# Patient Record
Sex: Male | Born: 1995
Health system: Southern US, Community
[De-identification: ages and names within clinical notes are randomized; demographics above are authoritative.]

## PROBLEM LIST (undated history)

## (undated) DIAGNOSIS — F419 Anxiety disorder, unspecified: Secondary | ICD-10-CM

## (undated) DIAGNOSIS — F429 Obsessive-compulsive disorder, unspecified: Secondary | ICD-10-CM

## (undated) DIAGNOSIS — F988 Other specified behavioral and emotional disorders with onset usually occurring in childhood and adolescence: Secondary | ICD-10-CM

## (undated) DIAGNOSIS — F909 Attention-deficit hyperactivity disorder, unspecified type: Secondary | ICD-10-CM

## (undated) DIAGNOSIS — K5090 Crohn's disease, unspecified, without complications: Secondary | ICD-10-CM

---
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
## 2017-03-12 DIAGNOSIS — K5090 Crohn's disease, unspecified, without complications: Secondary | ICD-10-CM | POA: Diagnosis not present

## 2017-04-15 DIAGNOSIS — K5090 Crohn's disease, unspecified, without complications: Secondary | ICD-10-CM | POA: Diagnosis not present

## 2017-04-16 ENCOUNTER — Ambulatory Visit: Payer: 59

## 2017-04-16 ENCOUNTER — Other Ambulatory Visit: Payer: Self-pay | Admitting: Gastroenterology

## 2017-04-16 ENCOUNTER — Encounter (INDEPENDENT_AMBULATORY_CARE_PROVIDER_SITE_OTHER): Payer: Self-pay

## 2017-04-16 DIAGNOSIS — K50919 Crohn's disease, unspecified, with unspecified complications: Secondary | ICD-10-CM

## 2017-04-16 DIAGNOSIS — K5000 Crohn's disease of small intestine without complications: Secondary | ICD-10-CM | POA: Diagnosis not present

## 2017-04-28 DIAGNOSIS — D1801 Hemangioma of skin and subcutaneous tissue: Secondary | ICD-10-CM | POA: Diagnosis not present

## 2017-04-28 DIAGNOSIS — D2239 Melanocytic nevi of other parts of face: Secondary | ICD-10-CM | POA: Diagnosis not present

## 2017-04-28 DIAGNOSIS — D485 Neoplasm of uncertain behavior of skin: Secondary | ICD-10-CM | POA: Diagnosis not present

## 2017-05-04 ENCOUNTER — Other Ambulatory Visit (HOSPITAL_COMMUNITY): Payer: Self-pay | Admitting: *Deleted

## 2017-05-04 DIAGNOSIS — K5090 Crohn's disease, unspecified, without complications: Secondary | ICD-10-CM | POA: Diagnosis not present

## 2017-05-04 DIAGNOSIS — Z6820 Body mass index (BMI) 20.0-20.9, adult: Secondary | ICD-10-CM | POA: Diagnosis not present

## 2017-05-05 ENCOUNTER — Ambulatory Visit (HOSPITAL_COMMUNITY): Admission: RE | Admit: 2017-05-05 | Payer: 59 | Source: Ambulatory Visit

## 2017-05-06 DIAGNOSIS — K5090 Crohn's disease, unspecified, without complications: Secondary | ICD-10-CM | POA: Diagnosis not present

## 2017-06-26 DIAGNOSIS — F419 Anxiety disorder, unspecified: Secondary | ICD-10-CM | POA: Diagnosis not present

## 2017-06-26 DIAGNOSIS — F429 Obsessive-compulsive disorder, unspecified: Secondary | ICD-10-CM | POA: Diagnosis not present

## 2017-06-26 MED FILL — FLUoxetine HCL 10 MG CAPS: 10 | 30 days supply | Qty: 30 | Fill #0

## 2017-07-01 DIAGNOSIS — K5090 Crohn's disease, unspecified, without complications: Secondary | ICD-10-CM | POA: Diagnosis not present

## 2017-07-10 DIAGNOSIS — Z23 Encounter for immunization: Secondary | ICD-10-CM | POA: Diagnosis not present

## 2017-07-10 DIAGNOSIS — F419 Anxiety disorder, unspecified: Secondary | ICD-10-CM | POA: Diagnosis not present

## 2017-07-10 MED FILL — FLUoxetine HCL 20 MG CAPS: 20 | 90 days supply | Qty: 90 | Fill #0

## 2017-08-26 DIAGNOSIS — K5090 Crohn's disease, unspecified, without complications: Secondary | ICD-10-CM | POA: Diagnosis not present

## 2017-09-09 DIAGNOSIS — M9903 Segmental and somatic dysfunction of lumbar region: Secondary | ICD-10-CM | POA: Diagnosis not present

## 2017-09-09 DIAGNOSIS — M9905 Segmental and somatic dysfunction of pelvic region: Secondary | ICD-10-CM | POA: Diagnosis not present

## 2017-09-09 DIAGNOSIS — M9908 Segmental and somatic dysfunction of rib cage: Secondary | ICD-10-CM | POA: Diagnosis not present

## 2017-09-09 DIAGNOSIS — M5415 Radiculopathy, thoracolumbar region: Secondary | ICD-10-CM | POA: Diagnosis not present

## 2017-09-09 DIAGNOSIS — M791 Myalgia, unspecified site: Secondary | ICD-10-CM | POA: Diagnosis not present

## 2017-09-09 DIAGNOSIS — M9902 Segmental and somatic dysfunction of thoracic region: Secondary | ICD-10-CM | POA: Diagnosis not present

## 2017-09-11 DIAGNOSIS — M9903 Segmental and somatic dysfunction of lumbar region: Secondary | ICD-10-CM | POA: Diagnosis not present

## 2017-09-11 DIAGNOSIS — M9905 Segmental and somatic dysfunction of pelvic region: Secondary | ICD-10-CM | POA: Diagnosis not present

## 2017-09-11 DIAGNOSIS — M9908 Segmental and somatic dysfunction of rib cage: Secondary | ICD-10-CM | POA: Diagnosis not present

## 2017-09-11 DIAGNOSIS — M5415 Radiculopathy, thoracolumbar region: Secondary | ICD-10-CM | POA: Diagnosis not present

## 2017-09-11 DIAGNOSIS — M9902 Segmental and somatic dysfunction of thoracic region: Secondary | ICD-10-CM | POA: Diagnosis not present

## 2017-09-11 DIAGNOSIS — M791 Myalgia, unspecified site: Secondary | ICD-10-CM | POA: Diagnosis not present

## 2017-09-14 DIAGNOSIS — M5415 Radiculopathy, thoracolumbar region: Secondary | ICD-10-CM | POA: Diagnosis not present

## 2017-09-14 DIAGNOSIS — M9903 Segmental and somatic dysfunction of lumbar region: Secondary | ICD-10-CM | POA: Diagnosis not present

## 2017-09-14 DIAGNOSIS — M9905 Segmental and somatic dysfunction of pelvic region: Secondary | ICD-10-CM | POA: Diagnosis not present

## 2017-09-14 DIAGNOSIS — M9908 Segmental and somatic dysfunction of rib cage: Secondary | ICD-10-CM | POA: Diagnosis not present

## 2017-09-14 DIAGNOSIS — M791 Myalgia, unspecified site: Secondary | ICD-10-CM | POA: Diagnosis not present

## 2017-09-14 DIAGNOSIS — M9902 Segmental and somatic dysfunction of thoracic region: Secondary | ICD-10-CM | POA: Diagnosis not present

## 2017-09-16 DIAGNOSIS — M791 Myalgia, unspecified site: Secondary | ICD-10-CM | POA: Diagnosis not present

## 2017-09-16 DIAGNOSIS — M9908 Segmental and somatic dysfunction of rib cage: Secondary | ICD-10-CM | POA: Diagnosis not present

## 2017-09-16 DIAGNOSIS — M9902 Segmental and somatic dysfunction of thoracic region: Secondary | ICD-10-CM | POA: Diagnosis not present

## 2017-09-16 DIAGNOSIS — M9905 Segmental and somatic dysfunction of pelvic region: Secondary | ICD-10-CM | POA: Diagnosis not present

## 2017-09-16 DIAGNOSIS — M9903 Segmental and somatic dysfunction of lumbar region: Secondary | ICD-10-CM | POA: Diagnosis not present

## 2017-09-16 DIAGNOSIS — M5415 Radiculopathy, thoracolumbar region: Secondary | ICD-10-CM | POA: Diagnosis not present

## 2017-09-21 DIAGNOSIS — M791 Myalgia, unspecified site: Secondary | ICD-10-CM | POA: Diagnosis not present

## 2017-09-21 DIAGNOSIS — M5415 Radiculopathy, thoracolumbar region: Secondary | ICD-10-CM | POA: Diagnosis not present

## 2017-09-21 DIAGNOSIS — M9902 Segmental and somatic dysfunction of thoracic region: Secondary | ICD-10-CM | POA: Diagnosis not present

## 2017-09-21 DIAGNOSIS — M9903 Segmental and somatic dysfunction of lumbar region: Secondary | ICD-10-CM | POA: Diagnosis not present

## 2017-09-21 DIAGNOSIS — M9905 Segmental and somatic dysfunction of pelvic region: Secondary | ICD-10-CM | POA: Diagnosis not present

## 2017-09-21 DIAGNOSIS — M9908 Segmental and somatic dysfunction of rib cage: Secondary | ICD-10-CM | POA: Diagnosis not present

## 2017-09-23 DIAGNOSIS — M9902 Segmental and somatic dysfunction of thoracic region: Secondary | ICD-10-CM | POA: Diagnosis not present

## 2017-09-23 DIAGNOSIS — M5415 Radiculopathy, thoracolumbar region: Secondary | ICD-10-CM | POA: Diagnosis not present

## 2017-09-23 DIAGNOSIS — M9903 Segmental and somatic dysfunction of lumbar region: Secondary | ICD-10-CM | POA: Diagnosis not present

## 2017-09-23 DIAGNOSIS — M9908 Segmental and somatic dysfunction of rib cage: Secondary | ICD-10-CM | POA: Diagnosis not present

## 2017-09-23 DIAGNOSIS — M791 Myalgia, unspecified site: Secondary | ICD-10-CM | POA: Diagnosis not present

## 2017-09-23 DIAGNOSIS — M9905 Segmental and somatic dysfunction of pelvic region: Secondary | ICD-10-CM | POA: Diagnosis not present

## 2017-09-25 DIAGNOSIS — M791 Myalgia, unspecified site: Secondary | ICD-10-CM | POA: Diagnosis not present

## 2017-09-25 DIAGNOSIS — M9908 Segmental and somatic dysfunction of rib cage: Secondary | ICD-10-CM | POA: Diagnosis not present

## 2017-09-25 DIAGNOSIS — M9902 Segmental and somatic dysfunction of thoracic region: Secondary | ICD-10-CM | POA: Diagnosis not present

## 2017-09-25 DIAGNOSIS — M5415 Radiculopathy, thoracolumbar region: Secondary | ICD-10-CM | POA: Diagnosis not present

## 2017-09-25 DIAGNOSIS — M9905 Segmental and somatic dysfunction of pelvic region: Secondary | ICD-10-CM | POA: Diagnosis not present

## 2017-09-25 DIAGNOSIS — M9903 Segmental and somatic dysfunction of lumbar region: Secondary | ICD-10-CM | POA: Diagnosis not present

## 2017-09-28 DIAGNOSIS — M5415 Radiculopathy, thoracolumbar region: Secondary | ICD-10-CM | POA: Diagnosis not present

## 2017-09-28 DIAGNOSIS — M791 Myalgia, unspecified site: Secondary | ICD-10-CM | POA: Diagnosis not present

## 2017-09-28 DIAGNOSIS — M9908 Segmental and somatic dysfunction of rib cage: Secondary | ICD-10-CM | POA: Diagnosis not present

## 2017-09-28 DIAGNOSIS — M9903 Segmental and somatic dysfunction of lumbar region: Secondary | ICD-10-CM | POA: Diagnosis not present

## 2017-09-28 DIAGNOSIS — M9902 Segmental and somatic dysfunction of thoracic region: Secondary | ICD-10-CM | POA: Diagnosis not present

## 2017-09-28 DIAGNOSIS — M9905 Segmental and somatic dysfunction of pelvic region: Secondary | ICD-10-CM | POA: Diagnosis not present

## 2017-10-02 DIAGNOSIS — M5415 Radiculopathy, thoracolumbar region: Secondary | ICD-10-CM | POA: Diagnosis not present

## 2017-10-02 DIAGNOSIS — M791 Myalgia, unspecified site: Secondary | ICD-10-CM | POA: Diagnosis not present

## 2017-10-02 DIAGNOSIS — M9908 Segmental and somatic dysfunction of rib cage: Secondary | ICD-10-CM | POA: Diagnosis not present

## 2017-10-02 DIAGNOSIS — M9903 Segmental and somatic dysfunction of lumbar region: Secondary | ICD-10-CM | POA: Diagnosis not present

## 2017-10-02 DIAGNOSIS — M9902 Segmental and somatic dysfunction of thoracic region: Secondary | ICD-10-CM | POA: Diagnosis not present

## 2017-10-02 DIAGNOSIS — M9905 Segmental and somatic dysfunction of pelvic region: Secondary | ICD-10-CM | POA: Diagnosis not present

## 2017-10-07 DIAGNOSIS — M791 Myalgia, unspecified site: Secondary | ICD-10-CM | POA: Diagnosis not present

## 2017-10-07 DIAGNOSIS — M5415 Radiculopathy, thoracolumbar region: Secondary | ICD-10-CM | POA: Diagnosis not present

## 2017-10-07 DIAGNOSIS — M9908 Segmental and somatic dysfunction of rib cage: Secondary | ICD-10-CM | POA: Diagnosis not present

## 2017-10-07 DIAGNOSIS — M9902 Segmental and somatic dysfunction of thoracic region: Secondary | ICD-10-CM | POA: Diagnosis not present

## 2017-10-07 DIAGNOSIS — M9903 Segmental and somatic dysfunction of lumbar region: Secondary | ICD-10-CM | POA: Diagnosis not present

## 2017-10-07 DIAGNOSIS — M9905 Segmental and somatic dysfunction of pelvic region: Secondary | ICD-10-CM | POA: Diagnosis not present

## 2017-10-08 MED FILL — FLUoxetine HCL 20 MG CAPS: 20 | 90 days supply | Qty: 90 | Fill #1

## 2017-10-09 DIAGNOSIS — M5415 Radiculopathy, thoracolumbar region: Secondary | ICD-10-CM | POA: Diagnosis not present

## 2017-10-09 DIAGNOSIS — M791 Myalgia, unspecified site: Secondary | ICD-10-CM | POA: Diagnosis not present

## 2017-10-09 DIAGNOSIS — M9902 Segmental and somatic dysfunction of thoracic region: Secondary | ICD-10-CM | POA: Diagnosis not present

## 2017-10-09 DIAGNOSIS — M9903 Segmental and somatic dysfunction of lumbar region: Secondary | ICD-10-CM | POA: Diagnosis not present

## 2017-10-09 DIAGNOSIS — M9905 Segmental and somatic dysfunction of pelvic region: Secondary | ICD-10-CM | POA: Diagnosis not present

## 2017-10-09 DIAGNOSIS — M9908 Segmental and somatic dysfunction of rib cage: Secondary | ICD-10-CM | POA: Diagnosis not present

## 2017-10-14 DIAGNOSIS — M791 Myalgia, unspecified site: Secondary | ICD-10-CM | POA: Diagnosis not present

## 2017-10-14 DIAGNOSIS — M5415 Radiculopathy, thoracolumbar region: Secondary | ICD-10-CM | POA: Diagnosis not present

## 2017-10-14 DIAGNOSIS — M9908 Segmental and somatic dysfunction of rib cage: Secondary | ICD-10-CM | POA: Diagnosis not present

## 2017-10-14 DIAGNOSIS — M9905 Segmental and somatic dysfunction of pelvic region: Secondary | ICD-10-CM | POA: Diagnosis not present

## 2017-10-14 DIAGNOSIS — M9903 Segmental and somatic dysfunction of lumbar region: Secondary | ICD-10-CM | POA: Diagnosis not present

## 2017-10-14 DIAGNOSIS — M9902 Segmental and somatic dysfunction of thoracic region: Secondary | ICD-10-CM | POA: Diagnosis not present

## 2017-10-22 DIAGNOSIS — K5090 Crohn's disease, unspecified, without complications: Secondary | ICD-10-CM | POA: Diagnosis not present

## 2017-10-22 DIAGNOSIS — Z79899 Other long term (current) drug therapy: Secondary | ICD-10-CM | POA: Diagnosis not present

## 2017-10-29 DIAGNOSIS — M791 Myalgia, unspecified site: Secondary | ICD-10-CM | POA: Diagnosis not present

## 2017-10-29 DIAGNOSIS — M5415 Radiculopathy, thoracolumbar region: Secondary | ICD-10-CM | POA: Diagnosis not present

## 2017-10-29 DIAGNOSIS — M9905 Segmental and somatic dysfunction of pelvic region: Secondary | ICD-10-CM | POA: Diagnosis not present

## 2017-10-29 DIAGNOSIS — M9903 Segmental and somatic dysfunction of lumbar region: Secondary | ICD-10-CM | POA: Diagnosis not present

## 2017-10-29 DIAGNOSIS — M9902 Segmental and somatic dysfunction of thoracic region: Secondary | ICD-10-CM | POA: Diagnosis not present

## 2017-10-29 DIAGNOSIS — M9908 Segmental and somatic dysfunction of rib cage: Secondary | ICD-10-CM | POA: Diagnosis not present

## 2017-11-12 DIAGNOSIS — K5090 Crohn's disease, unspecified, without complications: Secondary | ICD-10-CM | POA: Diagnosis not present

## 2017-11-19 DIAGNOSIS — M9903 Segmental and somatic dysfunction of lumbar region: Secondary | ICD-10-CM | POA: Diagnosis not present

## 2017-11-19 DIAGNOSIS — M9905 Segmental and somatic dysfunction of pelvic region: Secondary | ICD-10-CM | POA: Diagnosis not present

## 2017-11-19 DIAGNOSIS — M5415 Radiculopathy, thoracolumbar region: Secondary | ICD-10-CM | POA: Diagnosis not present

## 2017-11-19 DIAGNOSIS — M9902 Segmental and somatic dysfunction of thoracic region: Secondary | ICD-10-CM | POA: Diagnosis not present

## 2017-11-19 DIAGNOSIS — M9908 Segmental and somatic dysfunction of rib cage: Secondary | ICD-10-CM | POA: Diagnosis not present

## 2017-11-19 DIAGNOSIS — M791 Myalgia, unspecified site: Secondary | ICD-10-CM | POA: Diagnosis not present

## 2017-12-17 DIAGNOSIS — K5090 Crohn's disease, unspecified, without complications: Secondary | ICD-10-CM | POA: Diagnosis not present

## 2017-12-18 DIAGNOSIS — M791 Myalgia, unspecified site: Secondary | ICD-10-CM | POA: Diagnosis not present

## 2017-12-18 DIAGNOSIS — M9902 Segmental and somatic dysfunction of thoracic region: Secondary | ICD-10-CM | POA: Diagnosis not present

## 2017-12-18 DIAGNOSIS — M9908 Segmental and somatic dysfunction of rib cage: Secondary | ICD-10-CM | POA: Diagnosis not present

## 2017-12-18 DIAGNOSIS — M5415 Radiculopathy, thoracolumbar region: Secondary | ICD-10-CM | POA: Diagnosis not present

## 2017-12-18 DIAGNOSIS — M9905 Segmental and somatic dysfunction of pelvic region: Secondary | ICD-10-CM | POA: Diagnosis not present

## 2017-12-18 DIAGNOSIS — M9903 Segmental and somatic dysfunction of lumbar region: Secondary | ICD-10-CM | POA: Diagnosis not present

## 2017-12-30 DIAGNOSIS — K5080 Crohn's disease of both small and large intestine without complications: Secondary | ICD-10-CM | POA: Diagnosis not present

## 2017-12-30 MED FILL — VIT D2 1.25 MG (50,000 UNIT: 1.25 MG | 56 days supply | Qty: 8 | Fill #0

## 2017-12-31 DIAGNOSIS — K5080 Crohn's disease of both small and large intestine without complications: Secondary | ICD-10-CM | POA: Diagnosis not present

## 2018-01-11 MED FILL — FLUoxetine HCL 20 MG CAPS: 20 | 90 days supply | Qty: 90 | Fill #2

## 2018-01-29 DIAGNOSIS — M9908 Segmental and somatic dysfunction of rib cage: Secondary | ICD-10-CM | POA: Diagnosis not present

## 2018-01-29 DIAGNOSIS — M791 Myalgia, unspecified site: Secondary | ICD-10-CM | POA: Diagnosis not present

## 2018-01-29 DIAGNOSIS — M9903 Segmental and somatic dysfunction of lumbar region: Secondary | ICD-10-CM | POA: Diagnosis not present

## 2018-01-29 DIAGNOSIS — M9905 Segmental and somatic dysfunction of pelvic region: Secondary | ICD-10-CM | POA: Diagnosis not present

## 2018-01-29 DIAGNOSIS — M5415 Radiculopathy, thoracolumbar region: Secondary | ICD-10-CM | POA: Diagnosis not present

## 2018-01-29 DIAGNOSIS — M9902 Segmental and somatic dysfunction of thoracic region: Secondary | ICD-10-CM | POA: Diagnosis not present

## 2018-02-11 DIAGNOSIS — K5090 Crohn's disease, unspecified, without complications: Secondary | ICD-10-CM | POA: Diagnosis not present

## 2018-02-12 MED FILL — VIT D2 1.25 MG (50,000 UNIT: 1.25 MG | 56 days supply | Qty: 8 | Fill #1

## 2018-02-16 DIAGNOSIS — D1801 Hemangioma of skin and subcutaneous tissue: Secondary | ICD-10-CM | POA: Diagnosis not present

## 2018-03-11 DIAGNOSIS — M791 Myalgia, unspecified site: Secondary | ICD-10-CM | POA: Diagnosis not present

## 2018-03-11 DIAGNOSIS — M9905 Segmental and somatic dysfunction of pelvic region: Secondary | ICD-10-CM | POA: Diagnosis not present

## 2018-03-11 DIAGNOSIS — M5415 Radiculopathy, thoracolumbar region: Secondary | ICD-10-CM | POA: Diagnosis not present

## 2018-03-11 DIAGNOSIS — M9908 Segmental and somatic dysfunction of rib cage: Secondary | ICD-10-CM | POA: Diagnosis not present

## 2018-03-11 DIAGNOSIS — M9903 Segmental and somatic dysfunction of lumbar region: Secondary | ICD-10-CM | POA: Diagnosis not present

## 2018-03-11 DIAGNOSIS — M9902 Segmental and somatic dysfunction of thoracic region: Secondary | ICD-10-CM | POA: Diagnosis not present

## 2018-04-08 DIAGNOSIS — K5080 Crohn's disease of both small and large intestine without complications: Secondary | ICD-10-CM | POA: Diagnosis not present

## 2018-04-08 MED FILL — VIT D2 1.25 MG (50,000 UNIT: 1.25 MG | 56 days supply | Qty: 8 | Fill #2

## 2018-04-08 MED FILL — FLUoxetine HCL 20 MG CAPS: 20 | 90 days supply | Qty: 90 | Fill #0

## 2018-05-06 DIAGNOSIS — M9905 Segmental and somatic dysfunction of pelvic region: Secondary | ICD-10-CM | POA: Diagnosis not present

## 2018-05-06 DIAGNOSIS — M5415 Radiculopathy, thoracolumbar region: Secondary | ICD-10-CM | POA: Diagnosis not present

## 2018-05-06 DIAGNOSIS — H5203 Hypermetropia, bilateral: Secondary | ICD-10-CM | POA: Diagnosis not present

## 2018-05-06 DIAGNOSIS — M791 Myalgia, unspecified site: Secondary | ICD-10-CM | POA: Diagnosis not present

## 2018-05-06 DIAGNOSIS — M9903 Segmental and somatic dysfunction of lumbar region: Secondary | ICD-10-CM | POA: Diagnosis not present

## 2018-05-06 DIAGNOSIS — M9902 Segmental and somatic dysfunction of thoracic region: Secondary | ICD-10-CM | POA: Diagnosis not present

## 2018-05-06 DIAGNOSIS — M9908 Segmental and somatic dysfunction of rib cage: Secondary | ICD-10-CM | POA: Diagnosis not present

## 2018-05-24 DIAGNOSIS — K5080 Crohn's disease of both small and large intestine without complications: Secondary | ICD-10-CM | POA: Diagnosis not present

## 2018-05-31 DIAGNOSIS — K5080 Crohn's disease of both small and large intestine without complications: Secondary | ICD-10-CM | POA: Diagnosis not present

## 2018-06-11 DIAGNOSIS — M5415 Radiculopathy, thoracolumbar region: Secondary | ICD-10-CM | POA: Diagnosis not present

## 2018-06-11 DIAGNOSIS — M9903 Segmental and somatic dysfunction of lumbar region: Secondary | ICD-10-CM | POA: Diagnosis not present

## 2018-06-11 DIAGNOSIS — M791 Myalgia, unspecified site: Secondary | ICD-10-CM | POA: Diagnosis not present

## 2018-06-11 DIAGNOSIS — M9908 Segmental and somatic dysfunction of rib cage: Secondary | ICD-10-CM | POA: Diagnosis not present

## 2018-06-11 DIAGNOSIS — M9905 Segmental and somatic dysfunction of pelvic region: Secondary | ICD-10-CM | POA: Diagnosis not present

## 2018-06-11 DIAGNOSIS — M9902 Segmental and somatic dysfunction of thoracic region: Secondary | ICD-10-CM | POA: Diagnosis not present

## 2018-07-12 MED FILL — FLUoxetine HCL 20 MG CAPS: 20 | 90 days supply | Qty: 90 | Fill #1

## 2018-07-23 DIAGNOSIS — Z23 Encounter for immunization: Secondary | ICD-10-CM | POA: Diagnosis not present

## 2018-07-27 DIAGNOSIS — K5080 Crohn's disease of both small and large intestine without complications: Secondary | ICD-10-CM | POA: Diagnosis not present

## 2018-08-06 DIAGNOSIS — M5415 Radiculopathy, thoracolumbar region: Secondary | ICD-10-CM | POA: Diagnosis not present

## 2018-08-06 DIAGNOSIS — M791 Myalgia, unspecified site: Secondary | ICD-10-CM | POA: Diagnosis not present

## 2018-08-06 DIAGNOSIS — M9905 Segmental and somatic dysfunction of pelvic region: Secondary | ICD-10-CM | POA: Diagnosis not present

## 2018-08-06 DIAGNOSIS — M9903 Segmental and somatic dysfunction of lumbar region: Secondary | ICD-10-CM | POA: Diagnosis not present

## 2018-08-06 DIAGNOSIS — M9908 Segmental and somatic dysfunction of rib cage: Secondary | ICD-10-CM | POA: Diagnosis not present

## 2018-08-06 DIAGNOSIS — M9902 Segmental and somatic dysfunction of thoracic region: Secondary | ICD-10-CM | POA: Diagnosis not present

## 2018-08-10 DIAGNOSIS — K5080 Crohn's disease of both small and large intestine without complications: Secondary | ICD-10-CM | POA: Diagnosis not present

## 2018-08-13 DIAGNOSIS — K5080 Crohn's disease of both small and large intestine without complications: Secondary | ICD-10-CM | POA: Diagnosis not present

## 2018-08-30 MED FILL — FOLIC ACID 1 MG TABS: 1 | 90 days supply | Qty: 90 | Fill #0

## 2018-08-30 MED FILL — METHOTREXATE SODIUM 2.5 MG: 2.5 | 28 days supply | Qty: 24 | Fill #0

## 2018-09-17 DIAGNOSIS — M5415 Radiculopathy, thoracolumbar region: Secondary | ICD-10-CM | POA: Diagnosis not present

## 2018-09-17 DIAGNOSIS — M9908 Segmental and somatic dysfunction of rib cage: Secondary | ICD-10-CM | POA: Diagnosis not present

## 2018-09-17 DIAGNOSIS — M9905 Segmental and somatic dysfunction of pelvic region: Secondary | ICD-10-CM | POA: Diagnosis not present

## 2018-09-17 DIAGNOSIS — M9902 Segmental and somatic dysfunction of thoracic region: Secondary | ICD-10-CM | POA: Diagnosis not present

## 2018-09-17 DIAGNOSIS — M9903 Segmental and somatic dysfunction of lumbar region: Secondary | ICD-10-CM | POA: Diagnosis not present

## 2018-09-17 DIAGNOSIS — M791 Myalgia, unspecified site: Secondary | ICD-10-CM | POA: Diagnosis not present

## 2018-09-22 DIAGNOSIS — K5080 Crohn's disease of both small and large intestine without complications: Secondary | ICD-10-CM | POA: Diagnosis not present

## 2018-09-22 MED FILL — METHOTREXATE SODIUM 2.5 MG: 2.5 | 28 days supply | Qty: 24 | Fill #1

## 2018-10-01 DIAGNOSIS — M9908 Segmental and somatic dysfunction of rib cage: Secondary | ICD-10-CM | POA: Diagnosis not present

## 2018-10-01 DIAGNOSIS — M9905 Segmental and somatic dysfunction of pelvic region: Secondary | ICD-10-CM | POA: Diagnosis not present

## 2018-10-01 DIAGNOSIS — M9903 Segmental and somatic dysfunction of lumbar region: Secondary | ICD-10-CM | POA: Diagnosis not present

## 2018-10-01 DIAGNOSIS — M9902 Segmental and somatic dysfunction of thoracic region: Secondary | ICD-10-CM | POA: Diagnosis not present

## 2018-10-01 DIAGNOSIS — M5415 Radiculopathy, thoracolumbar region: Secondary | ICD-10-CM | POA: Diagnosis not present

## 2018-10-01 DIAGNOSIS — M791 Myalgia, unspecified site: Secondary | ICD-10-CM | POA: Diagnosis not present

## 2018-10-15 DIAGNOSIS — M5415 Radiculopathy, thoracolumbar region: Secondary | ICD-10-CM | POA: Diagnosis not present

## 2018-10-15 DIAGNOSIS — M9903 Segmental and somatic dysfunction of lumbar region: Secondary | ICD-10-CM | POA: Diagnosis not present

## 2018-10-15 DIAGNOSIS — M791 Myalgia, unspecified site: Secondary | ICD-10-CM | POA: Diagnosis not present

## 2018-10-15 DIAGNOSIS — M9902 Segmental and somatic dysfunction of thoracic region: Secondary | ICD-10-CM | POA: Diagnosis not present

## 2018-10-15 DIAGNOSIS — M9905 Segmental and somatic dysfunction of pelvic region: Secondary | ICD-10-CM | POA: Diagnosis not present

## 2018-10-15 DIAGNOSIS — M9908 Segmental and somatic dysfunction of rib cage: Secondary | ICD-10-CM | POA: Diagnosis not present

## 2018-10-15 MED FILL — FLUoxetine HCL 20 MG CAPS: 20 | 90 days supply | Qty: 90 | Fill #2

## 2018-10-25 DIAGNOSIS — M5415 Radiculopathy, thoracolumbar region: Secondary | ICD-10-CM | POA: Diagnosis not present

## 2018-10-25 DIAGNOSIS — M9908 Segmental and somatic dysfunction of rib cage: Secondary | ICD-10-CM | POA: Diagnosis not present

## 2018-10-25 DIAGNOSIS — M9902 Segmental and somatic dysfunction of thoracic region: Secondary | ICD-10-CM | POA: Diagnosis not present

## 2018-10-25 DIAGNOSIS — M9903 Segmental and somatic dysfunction of lumbar region: Secondary | ICD-10-CM | POA: Diagnosis not present

## 2018-10-25 DIAGNOSIS — M791 Myalgia, unspecified site: Secondary | ICD-10-CM | POA: Diagnosis not present

## 2018-10-25 DIAGNOSIS — M9905 Segmental and somatic dysfunction of pelvic region: Secondary | ICD-10-CM | POA: Diagnosis not present

## 2018-10-25 MED FILL — METHOTREXATE SODIUM 2.5 MG: 2.5 | 28 days supply | Qty: 24 | Fill #2

## 2018-11-15 DIAGNOSIS — K5080 Crohn's disease of both small and large intestine without complications: Secondary | ICD-10-CM | POA: Diagnosis not present

## 2018-11-22 MED FILL — METHOTREXATE SODIUM 2.5 MG: 2.5 | 28 days supply | Qty: 24 | Fill #3

## 2018-11-22 MED FILL — FOLIC ACID 1 MG TABS: 1 | 90 days supply | Qty: 90 | Fill #1

## 2018-11-26 DIAGNOSIS — M9903 Segmental and somatic dysfunction of lumbar region: Secondary | ICD-10-CM | POA: Diagnosis not present

## 2018-11-26 DIAGNOSIS — M9908 Segmental and somatic dysfunction of rib cage: Secondary | ICD-10-CM | POA: Diagnosis not present

## 2018-11-26 DIAGNOSIS — M9902 Segmental and somatic dysfunction of thoracic region: Secondary | ICD-10-CM | POA: Diagnosis not present

## 2018-11-26 DIAGNOSIS — M5415 Radiculopathy, thoracolumbar region: Secondary | ICD-10-CM | POA: Diagnosis not present

## 2018-11-26 DIAGNOSIS — M9905 Segmental and somatic dysfunction of pelvic region: Secondary | ICD-10-CM | POA: Diagnosis not present

## 2018-11-26 DIAGNOSIS — M791 Myalgia, unspecified site: Secondary | ICD-10-CM | POA: Diagnosis not present

## 2018-12-24 DIAGNOSIS — M9902 Segmental and somatic dysfunction of thoracic region: Secondary | ICD-10-CM | POA: Diagnosis not present

## 2018-12-24 DIAGNOSIS — M5415 Radiculopathy, thoracolumbar region: Secondary | ICD-10-CM | POA: Diagnosis not present

## 2018-12-24 DIAGNOSIS — M9905 Segmental and somatic dysfunction of pelvic region: Secondary | ICD-10-CM | POA: Diagnosis not present

## 2018-12-24 DIAGNOSIS — M9908 Segmental and somatic dysfunction of rib cage: Secondary | ICD-10-CM | POA: Diagnosis not present

## 2018-12-24 DIAGNOSIS — M9903 Segmental and somatic dysfunction of lumbar region: Secondary | ICD-10-CM | POA: Diagnosis not present

## 2018-12-24 DIAGNOSIS — M791 Myalgia, unspecified site: Secondary | ICD-10-CM | POA: Diagnosis not present

## 2018-12-28 MED FILL — METHOTREXATE SODIUM 2.5 MG: 2.5 | 28 days supply | Qty: 24 | Fill #0

## 2018-12-31 DIAGNOSIS — F419 Anxiety disorder, unspecified: Secondary | ICD-10-CM | POA: Diagnosis not present

## 2018-12-31 DIAGNOSIS — H6123 Impacted cerumen, bilateral: Secondary | ICD-10-CM | POA: Diagnosis not present

## 2018-12-31 MED FILL — FLUoxetine HCL 10 MG CAPS: 10 | 30 days supply | Qty: 30 | Fill #0

## 2018-12-31 MED FILL — SERTRALINE HCL 25 MG TABLET: 25 | 60 days supply | Qty: 60 | Fill #0 | Status: TO

## 2019-01-11 DIAGNOSIS — K5080 Crohn's disease of both small and large intestine without complications: Secondary | ICD-10-CM | POA: Diagnosis not present

## 2019-01-19 MED FILL — METHOTREXATE SODIUM 2.5 MG: 2.5 | 28 days supply | Qty: 24 | Fill #1 | Status: TO

## 2019-01-21 DIAGNOSIS — M9903 Segmental and somatic dysfunction of lumbar region: Secondary | ICD-10-CM | POA: Diagnosis not present

## 2019-01-21 DIAGNOSIS — M9902 Segmental and somatic dysfunction of thoracic region: Secondary | ICD-10-CM | POA: Diagnosis not present

## 2019-01-21 DIAGNOSIS — M791 Myalgia, unspecified site: Secondary | ICD-10-CM | POA: Diagnosis not present

## 2019-01-21 DIAGNOSIS — M9908 Segmental and somatic dysfunction of rib cage: Secondary | ICD-10-CM | POA: Diagnosis not present

## 2019-01-21 DIAGNOSIS — M9905 Segmental and somatic dysfunction of pelvic region: Secondary | ICD-10-CM | POA: Diagnosis not present

## 2019-01-21 DIAGNOSIS — M5415 Radiculopathy, thoracolumbar region: Secondary | ICD-10-CM | POA: Diagnosis not present

## 2019-02-02 MED FILL — FOLIC ACID 1 MG TABLET: 1 | 90 days supply | Qty: 90 | Fill #0

## 2019-02-08 MED FILL — SERTRALINE HCL 50 MG TABLET: 50 | 90 days supply | Qty: 90 | Fill #0

## 2019-02-10 MED FILL — METHOTREXATE SODIUM 2.5 MG: 2.5 | 28 days supply | Qty: 24 | Fill #0

## 2019-02-16 DIAGNOSIS — K5080 Crohn's disease of both small and large intestine without complications: Secondary | ICD-10-CM | POA: Diagnosis not present

## 2019-02-16 DIAGNOSIS — Z79899 Other long term (current) drug therapy: Secondary | ICD-10-CM | POA: Diagnosis not present

## 2019-02-17 MED FILL — SERTRALINE HCL 25 MG TABLET: 25 | 60 days supply | Qty: 60 | Fill #0

## 2019-02-25 DIAGNOSIS — M791 Myalgia, unspecified site: Secondary | ICD-10-CM | POA: Diagnosis not present

## 2019-02-25 DIAGNOSIS — M9908 Segmental and somatic dysfunction of rib cage: Secondary | ICD-10-CM | POA: Diagnosis not present

## 2019-02-25 DIAGNOSIS — M9902 Segmental and somatic dysfunction of thoracic region: Secondary | ICD-10-CM | POA: Diagnosis not present

## 2019-02-25 DIAGNOSIS — M9903 Segmental and somatic dysfunction of lumbar region: Secondary | ICD-10-CM | POA: Diagnosis not present

## 2019-02-25 DIAGNOSIS — M9905 Segmental and somatic dysfunction of pelvic region: Secondary | ICD-10-CM | POA: Diagnosis not present

## 2019-02-25 DIAGNOSIS — M5415 Radiculopathy, thoracolumbar region: Secondary | ICD-10-CM | POA: Diagnosis not present

## 2019-03-01 DIAGNOSIS — Z79899 Other long term (current) drug therapy: Secondary | ICD-10-CM | POA: Diagnosis not present

## 2019-03-01 DIAGNOSIS — K5080 Crohn's disease of both small and large intestine without complications: Secondary | ICD-10-CM | POA: Diagnosis not present

## 2019-03-02 DIAGNOSIS — K5080 Crohn's disease of both small and large intestine without complications: Secondary | ICD-10-CM | POA: Diagnosis not present

## 2019-03-04 MED FILL — METHOTREXATE SODIUM 2.5 MG: 2.5 | 28 days supply | Qty: 24 | Fill #1

## 2019-03-09 DIAGNOSIS — K5080 Crohn's disease of both small and large intestine without complications: Secondary | ICD-10-CM | POA: Diagnosis not present

## 2019-03-09 DIAGNOSIS — Z79899 Other long term (current) drug therapy: Secondary | ICD-10-CM | POA: Diagnosis not present

## 2019-04-02 MED FILL — METHOTREXATE SODIUM 2.5 MG: 2.5 | 84 days supply | Qty: 72 | Fill #0

## 2019-04-12 MED FILL — SERTRALINE HCL 25 MG TABLET: 25 | 60 days supply | Qty: 60 | Fill #1

## 2019-04-18 MED FILL — METHOTREXATE SODIUM 2.5 MG: 2.5 | 84 days supply | Qty: 72 | Fill #0

## 2019-04-27 DIAGNOSIS — M9905 Segmental and somatic dysfunction of pelvic region: Secondary | ICD-10-CM | POA: Diagnosis not present

## 2019-04-27 DIAGNOSIS — M9903 Segmental and somatic dysfunction of lumbar region: Secondary | ICD-10-CM | POA: Diagnosis not present

## 2019-04-27 DIAGNOSIS — M791 Myalgia, unspecified site: Secondary | ICD-10-CM | POA: Diagnosis not present

## 2019-04-27 DIAGNOSIS — M5415 Radiculopathy, thoracolumbar region: Secondary | ICD-10-CM | POA: Diagnosis not present

## 2019-04-27 DIAGNOSIS — M9908 Segmental and somatic dysfunction of rib cage: Secondary | ICD-10-CM | POA: Diagnosis not present

## 2019-04-27 DIAGNOSIS — M9902 Segmental and somatic dysfunction of thoracic region: Secondary | ICD-10-CM | POA: Diagnosis not present

## 2019-04-29 MED FILL — FOLIC ACID 1 MG TABS: 1 | 90 days supply | Qty: 90 | Fill #1

## 2019-05-03 DIAGNOSIS — K5080 Crohn's disease of both small and large intestine without complications: Secondary | ICD-10-CM | POA: Diagnosis not present

## 2019-05-05 MED FILL — SERTRALINE HCL 50 MG TABS: 50 | 90 days supply | Qty: 90 | Fill #1

## 2019-05-24 DIAGNOSIS — H5203 Hypermetropia, bilateral: Secondary | ICD-10-CM | POA: Diagnosis not present

## 2019-05-25 DIAGNOSIS — M791 Myalgia, unspecified site: Secondary | ICD-10-CM | POA: Diagnosis not present

## 2019-05-25 DIAGNOSIS — M5415 Radiculopathy, thoracolumbar region: Secondary | ICD-10-CM | POA: Diagnosis not present

## 2019-05-25 DIAGNOSIS — M9908 Segmental and somatic dysfunction of rib cage: Secondary | ICD-10-CM | POA: Diagnosis not present

## 2019-05-25 DIAGNOSIS — M9902 Segmental and somatic dysfunction of thoracic region: Secondary | ICD-10-CM | POA: Diagnosis not present

## 2019-05-25 DIAGNOSIS — M9905 Segmental and somatic dysfunction of pelvic region: Secondary | ICD-10-CM | POA: Diagnosis not present

## 2019-05-25 DIAGNOSIS — M9903 Segmental and somatic dysfunction of lumbar region: Secondary | ICD-10-CM | POA: Diagnosis not present

## 2019-06-30 DIAGNOSIS — M791 Myalgia, unspecified site: Secondary | ICD-10-CM | POA: Diagnosis not present

## 2019-06-30 DIAGNOSIS — M9902 Segmental and somatic dysfunction of thoracic region: Secondary | ICD-10-CM | POA: Diagnosis not present

## 2019-06-30 DIAGNOSIS — M9905 Segmental and somatic dysfunction of pelvic region: Secondary | ICD-10-CM | POA: Diagnosis not present

## 2019-06-30 DIAGNOSIS — M5415 Radiculopathy, thoracolumbar region: Secondary | ICD-10-CM | POA: Diagnosis not present

## 2019-06-30 DIAGNOSIS — M9903 Segmental and somatic dysfunction of lumbar region: Secondary | ICD-10-CM | POA: Diagnosis not present

## 2019-06-30 DIAGNOSIS — M9908 Segmental and somatic dysfunction of rib cage: Secondary | ICD-10-CM | POA: Diagnosis not present

## 2019-07-01 DIAGNOSIS — K5080 Crohn's disease of both small and large intestine without complications: Secondary | ICD-10-CM | POA: Diagnosis not present

## 2019-07-01 MED FILL — METHOTREXATE SODIUM 2.5 MG: 2.5 | 84 days supply | Qty: 72 | Fill #0

## 2019-07-22 DIAGNOSIS — Z23 Encounter for immunization: Secondary | ICD-10-CM | POA: Diagnosis not present

## 2019-07-27 MED FILL — FOLIC ACID 1 MG TABS: 1 | 90 days supply | Qty: 90 | Fill #0

## 2019-08-04 DIAGNOSIS — M9903 Segmental and somatic dysfunction of lumbar region: Secondary | ICD-10-CM | POA: Diagnosis not present

## 2019-08-04 DIAGNOSIS — M791 Myalgia, unspecified site: Secondary | ICD-10-CM | POA: Diagnosis not present

## 2019-08-04 DIAGNOSIS — M9908 Segmental and somatic dysfunction of rib cage: Secondary | ICD-10-CM | POA: Diagnosis not present

## 2019-08-04 DIAGNOSIS — M5415 Radiculopathy, thoracolumbar region: Secondary | ICD-10-CM | POA: Diagnosis not present

## 2019-08-04 DIAGNOSIS — M9902 Segmental and somatic dysfunction of thoracic region: Secondary | ICD-10-CM | POA: Diagnosis not present

## 2019-08-04 DIAGNOSIS — M9905 Segmental and somatic dysfunction of pelvic region: Secondary | ICD-10-CM | POA: Diagnosis not present

## 2019-08-08 MED FILL — SERTRALINE HCL 50 MG TABLET: 50 | 90 days supply | Qty: 90 | Fill #2

## 2019-08-26 DIAGNOSIS — K5080 Crohn's disease of both small and large intestine without complications: Secondary | ICD-10-CM | POA: Diagnosis not present

## 2019-09-15 DIAGNOSIS — M9908 Segmental and somatic dysfunction of rib cage: Secondary | ICD-10-CM | POA: Diagnosis not present

## 2019-09-15 DIAGNOSIS — M5415 Radiculopathy, thoracolumbar region: Secondary | ICD-10-CM | POA: Diagnosis not present

## 2019-09-15 DIAGNOSIS — M9902 Segmental and somatic dysfunction of thoracic region: Secondary | ICD-10-CM | POA: Diagnosis not present

## 2019-09-15 DIAGNOSIS — M791 Myalgia, unspecified site: Secondary | ICD-10-CM | POA: Diagnosis not present

## 2019-09-15 DIAGNOSIS — M9905 Segmental and somatic dysfunction of pelvic region: Secondary | ICD-10-CM | POA: Diagnosis not present

## 2019-09-15 DIAGNOSIS — M9903 Segmental and somatic dysfunction of lumbar region: Secondary | ICD-10-CM | POA: Diagnosis not present

## 2019-10-06 MED FILL — METHOTREXATE SODIUM 2.5 MG: 2.5 | 84 days supply | Qty: 72 | Fill #1

## 2019-10-13 DIAGNOSIS — D225 Melanocytic nevi of trunk: Secondary | ICD-10-CM | POA: Diagnosis not present

## 2019-10-13 DIAGNOSIS — D1801 Hemangioma of skin and subcutaneous tissue: Secondary | ICD-10-CM | POA: Diagnosis not present

## 2019-10-13 DIAGNOSIS — L821 Other seborrheic keratosis: Secondary | ICD-10-CM | POA: Diagnosis not present

## 2019-10-14 DIAGNOSIS — M5415 Radiculopathy, thoracolumbar region: Secondary | ICD-10-CM | POA: Diagnosis not present

## 2019-10-14 DIAGNOSIS — M9902 Segmental and somatic dysfunction of thoracic region: Secondary | ICD-10-CM | POA: Diagnosis not present

## 2019-10-14 DIAGNOSIS — M9908 Segmental and somatic dysfunction of rib cage: Secondary | ICD-10-CM | POA: Diagnosis not present

## 2019-10-14 DIAGNOSIS — M9905 Segmental and somatic dysfunction of pelvic region: Secondary | ICD-10-CM | POA: Diagnosis not present

## 2019-10-14 DIAGNOSIS — M791 Myalgia, unspecified site: Secondary | ICD-10-CM | POA: Diagnosis not present

## 2019-10-14 DIAGNOSIS — M9903 Segmental and somatic dysfunction of lumbar region: Secondary | ICD-10-CM | POA: Diagnosis not present

## 2019-10-17 DIAGNOSIS — M9908 Segmental and somatic dysfunction of rib cage: Secondary | ICD-10-CM | POA: Diagnosis not present

## 2019-10-17 DIAGNOSIS — M5415 Radiculopathy, thoracolumbar region: Secondary | ICD-10-CM | POA: Diagnosis not present

## 2019-10-17 DIAGNOSIS — M9903 Segmental and somatic dysfunction of lumbar region: Secondary | ICD-10-CM | POA: Diagnosis not present

## 2019-10-17 DIAGNOSIS — M9905 Segmental and somatic dysfunction of pelvic region: Secondary | ICD-10-CM | POA: Diagnosis not present

## 2019-10-17 DIAGNOSIS — M791 Myalgia, unspecified site: Secondary | ICD-10-CM | POA: Diagnosis not present

## 2019-10-17 DIAGNOSIS — M9902 Segmental and somatic dysfunction of thoracic region: Secondary | ICD-10-CM | POA: Diagnosis not present

## 2019-10-19 DIAGNOSIS — K5080 Crohn's disease of both small and large intestine without complications: Secondary | ICD-10-CM | POA: Diagnosis not present

## 2019-10-24 MED FILL — FOLIC ACID 1 MG TABS: 1 | 90 days supply | Qty: 90 | Fill #0

## 2019-10-26 DIAGNOSIS — M9903 Segmental and somatic dysfunction of lumbar region: Secondary | ICD-10-CM | POA: Diagnosis not present

## 2019-10-26 DIAGNOSIS — M5415 Radiculopathy, thoracolumbar region: Secondary | ICD-10-CM | POA: Diagnosis not present

## 2019-10-26 DIAGNOSIS — M9905 Segmental and somatic dysfunction of pelvic region: Secondary | ICD-10-CM | POA: Diagnosis not present

## 2019-10-26 DIAGNOSIS — M9908 Segmental and somatic dysfunction of rib cage: Secondary | ICD-10-CM | POA: Diagnosis not present

## 2019-10-26 DIAGNOSIS — M9902 Segmental and somatic dysfunction of thoracic region: Secondary | ICD-10-CM | POA: Diagnosis not present

## 2019-10-26 DIAGNOSIS — M791 Myalgia, unspecified site: Secondary | ICD-10-CM | POA: Diagnosis not present

## 2019-11-08 MED FILL — SERTRALINE HCL 50 MG TABLET: 50 | 90 days supply | Qty: 90 | Fill #3

## 2019-11-10 DIAGNOSIS — K5080 Crohn's disease of both small and large intestine without complications: Secondary | ICD-10-CM | POA: Diagnosis not present

## 2019-11-10 DIAGNOSIS — Z79899 Other long term (current) drug therapy: Secondary | ICD-10-CM | POA: Diagnosis not present

## 2019-11-25 DIAGNOSIS — M791 Myalgia, unspecified site: Secondary | ICD-10-CM | POA: Diagnosis not present

## 2019-11-25 DIAGNOSIS — M9905 Segmental and somatic dysfunction of pelvic region: Secondary | ICD-10-CM | POA: Diagnosis not present

## 2019-11-25 DIAGNOSIS — M9903 Segmental and somatic dysfunction of lumbar region: Secondary | ICD-10-CM | POA: Diagnosis not present

## 2019-11-25 DIAGNOSIS — M9908 Segmental and somatic dysfunction of rib cage: Secondary | ICD-10-CM | POA: Diagnosis not present

## 2019-11-25 DIAGNOSIS — M9902 Segmental and somatic dysfunction of thoracic region: Secondary | ICD-10-CM | POA: Diagnosis not present

## 2019-11-25 DIAGNOSIS — M5415 Radiculopathy, thoracolumbar region: Secondary | ICD-10-CM | POA: Diagnosis not present

## 2019-12-02 MED FILL — HYDROCORTISONE AC 25 MG SUP: 25 | 15 days supply | Qty: 30 | Fill #0

## 2019-12-09 DIAGNOSIS — K5080 Crohn's disease of both small and large intestine without complications: Secondary | ICD-10-CM | POA: Diagnosis not present

## 2019-12-20 DIAGNOSIS — K5080 Crohn's disease of both small and large intestine without complications: Secondary | ICD-10-CM | POA: Diagnosis not present

## 2019-12-20 MED FILL — predniSONE 10 MG TABS: 10 | 60 days supply | Qty: 110 | Fill #0

## 2019-12-22 DIAGNOSIS — M9902 Segmental and somatic dysfunction of thoracic region: Secondary | ICD-10-CM | POA: Diagnosis not present

## 2019-12-22 DIAGNOSIS — M9903 Segmental and somatic dysfunction of lumbar region: Secondary | ICD-10-CM | POA: Diagnosis not present

## 2019-12-22 DIAGNOSIS — M9908 Segmental and somatic dysfunction of rib cage: Secondary | ICD-10-CM | POA: Diagnosis not present

## 2019-12-22 DIAGNOSIS — M5415 Radiculopathy, thoracolumbar region: Secondary | ICD-10-CM | POA: Diagnosis not present

## 2019-12-22 DIAGNOSIS — M791 Myalgia, unspecified site: Secondary | ICD-10-CM | POA: Diagnosis not present

## 2019-12-22 DIAGNOSIS — M9905 Segmental and somatic dysfunction of pelvic region: Secondary | ICD-10-CM | POA: Diagnosis not present

## 2019-12-23 DIAGNOSIS — K5080 Crohn's disease of both small and large intestine without complications: Secondary | ICD-10-CM | POA: Diagnosis not present

## 2019-12-27 DIAGNOSIS — K5080 Crohn's disease of both small and large intestine without complications: Secondary | ICD-10-CM | POA: Diagnosis not present

## 2019-12-27 DIAGNOSIS — K828 Other specified diseases of gallbladder: Secondary | ICD-10-CM | POA: Diagnosis not present

## 2019-12-27 DIAGNOSIS — K5900 Constipation, unspecified: Secondary | ICD-10-CM | POA: Diagnosis not present

## 2020-01-16 MED FILL — METHOTREXATE SODIUM 2.5 MG: 2.5 | 84 days supply | Qty: 72 | Fill #2

## 2020-01-16 MED FILL — FOLIC ACID 1 MG TABS: 1 | 90 days supply | Qty: 90 | Fill #1

## 2020-01-19 DIAGNOSIS — M9905 Segmental and somatic dysfunction of pelvic region: Secondary | ICD-10-CM | POA: Diagnosis not present

## 2020-01-19 DIAGNOSIS — M9908 Segmental and somatic dysfunction of rib cage: Secondary | ICD-10-CM | POA: Diagnosis not present

## 2020-01-19 DIAGNOSIS — M9903 Segmental and somatic dysfunction of lumbar region: Secondary | ICD-10-CM | POA: Diagnosis not present

## 2020-01-19 DIAGNOSIS — M5415 Radiculopathy, thoracolumbar region: Secondary | ICD-10-CM | POA: Diagnosis not present

## 2020-01-19 DIAGNOSIS — M791 Myalgia, unspecified site: Secondary | ICD-10-CM | POA: Diagnosis not present

## 2020-01-19 DIAGNOSIS — M9902 Segmental and somatic dysfunction of thoracic region: Secondary | ICD-10-CM | POA: Diagnosis not present

## 2020-01-19 MED FILL — SERTRALINE HCL 50 MG TABLET: 50 | 90 days supply | Qty: 90 | Fill #0

## 2020-02-06 MED FILL — SERTRALINE HCL 50 MG TABLET: 50 | 90 days supply | Qty: 90 | Fill #0

## 2020-02-07 DIAGNOSIS — K5080 Crohn's disease of both small and large intestine without complications: Secondary | ICD-10-CM | POA: Diagnosis not present

## 2020-02-07 DIAGNOSIS — K648 Other hemorrhoids: Secondary | ICD-10-CM | POA: Diagnosis not present

## 2020-02-07 DIAGNOSIS — Z79899 Other long term (current) drug therapy: Secondary | ICD-10-CM | POA: Diagnosis not present

## 2020-02-13 DIAGNOSIS — K5080 Crohn's disease of both small and large intestine without complications: Secondary | ICD-10-CM | POA: Diagnosis not present

## 2020-03-01 DIAGNOSIS — M9903 Segmental and somatic dysfunction of lumbar region: Secondary | ICD-10-CM | POA: Diagnosis not present

## 2020-03-01 DIAGNOSIS — M9902 Segmental and somatic dysfunction of thoracic region: Secondary | ICD-10-CM | POA: Diagnosis not present

## 2020-03-01 DIAGNOSIS — M9908 Segmental and somatic dysfunction of rib cage: Secondary | ICD-10-CM | POA: Diagnosis not present

## 2020-03-01 DIAGNOSIS — M9905 Segmental and somatic dysfunction of pelvic region: Secondary | ICD-10-CM | POA: Diagnosis not present

## 2020-03-01 DIAGNOSIS — M791 Myalgia, unspecified site: Secondary | ICD-10-CM | POA: Diagnosis not present

## 2020-03-01 DIAGNOSIS — M5415 Radiculopathy, thoracolumbar region: Secondary | ICD-10-CM | POA: Diagnosis not present

## 2020-03-07 DIAGNOSIS — K5080 Crohn's disease of both small and large intestine without complications: Secondary | ICD-10-CM | POA: Diagnosis not present

## 2020-03-07 DIAGNOSIS — K6389 Other specified diseases of intestine: Secondary | ICD-10-CM | POA: Diagnosis not present

## 2020-03-07 DIAGNOSIS — K648 Other hemorrhoids: Secondary | ICD-10-CM | POA: Diagnosis not present

## 2020-03-29 ENCOUNTER — Other Ambulatory Visit (HOSPITAL_BASED_OUTPATIENT_CLINIC_OR_DEPARTMENT_OTHER): Payer: Self-pay | Admitting: Gastroenterology

## 2020-03-29 DIAGNOSIS — M791 Myalgia, unspecified site: Secondary | ICD-10-CM | POA: Diagnosis not present

## 2020-03-29 DIAGNOSIS — M5415 Radiculopathy, thoracolumbar region: Secondary | ICD-10-CM | POA: Diagnosis not present

## 2020-03-29 DIAGNOSIS — M9905 Segmental and somatic dysfunction of pelvic region: Secondary | ICD-10-CM | POA: Diagnosis not present

## 2020-03-29 DIAGNOSIS — M9902 Segmental and somatic dysfunction of thoracic region: Secondary | ICD-10-CM | POA: Diagnosis not present

## 2020-03-29 DIAGNOSIS — M9903 Segmental and somatic dysfunction of lumbar region: Secondary | ICD-10-CM | POA: Diagnosis not present

## 2020-03-29 DIAGNOSIS — M9908 Segmental and somatic dysfunction of rib cage: Secondary | ICD-10-CM | POA: Diagnosis not present

## 2020-03-29 MED FILL — METHOTREXATE 2.5 MG TAB: 2.5 | 84 days supply | Qty: 72 | Fill #0

## 2020-04-11 DIAGNOSIS — K5080 Crohn's disease of both small and large intestine without complications: Secondary | ICD-10-CM | POA: Diagnosis not present

## 2020-05-01 DIAGNOSIS — H5203 Hypermetropia, bilateral: Secondary | ICD-10-CM | POA: Diagnosis not present

## 2020-05-10 DIAGNOSIS — M9905 Segmental and somatic dysfunction of pelvic region: Secondary | ICD-10-CM | POA: Diagnosis not present

## 2020-05-10 DIAGNOSIS — M9908 Segmental and somatic dysfunction of rib cage: Secondary | ICD-10-CM | POA: Diagnosis not present

## 2020-05-10 DIAGNOSIS — M9903 Segmental and somatic dysfunction of lumbar region: Secondary | ICD-10-CM | POA: Diagnosis not present

## 2020-05-10 DIAGNOSIS — M9902 Segmental and somatic dysfunction of thoracic region: Secondary | ICD-10-CM | POA: Diagnosis not present

## 2020-05-10 DIAGNOSIS — M5415 Radiculopathy, thoracolumbar region: Secondary | ICD-10-CM | POA: Diagnosis not present

## 2020-05-10 DIAGNOSIS — M791 Myalgia, unspecified site: Secondary | ICD-10-CM | POA: Diagnosis not present

## 2020-05-14 MED FILL — SERTRALINE HCL 50 MG TABLET: 50 | 90 days supply | Qty: 90 | Fill #1

## 2020-05-15 DIAGNOSIS — D1801 Hemangioma of skin and subcutaneous tissue: Secondary | ICD-10-CM | POA: Diagnosis not present

## 2020-05-15 DIAGNOSIS — H02822 Cysts of right lower eyelid: Secondary | ICD-10-CM | POA: Diagnosis not present

## 2020-05-15 DIAGNOSIS — D2239 Melanocytic nevi of other parts of face: Secondary | ICD-10-CM | POA: Diagnosis not present

## 2020-05-16 MED FILL — SULCONAZOLE NITRATE 1 % SOL: 1 | 30 days supply | Qty: 30 | Fill #0

## 2020-05-30 DIAGNOSIS — K5080 Crohn's disease of both small and large intestine without complications: Secondary | ICD-10-CM | POA: Diagnosis not present

## 2020-06-04 DIAGNOSIS — K5080 Crohn's disease of both small and large intestine without complications: Secondary | ICD-10-CM | POA: Diagnosis not present

## 2020-06-12 ENCOUNTER — Other Ambulatory Visit (HOSPITAL_BASED_OUTPATIENT_CLINIC_OR_DEPARTMENT_OTHER): Payer: Self-pay | Admitting: Gastroenterology

## 2020-06-12 MED FILL — FOLIC ACID 1 MG TABS: 1 | 90 days supply | Qty: 90 | Fill #0

## 2020-06-22 DIAGNOSIS — M9908 Segmental and somatic dysfunction of rib cage: Secondary | ICD-10-CM | POA: Diagnosis not present

## 2020-06-22 DIAGNOSIS — M791 Myalgia, unspecified site: Secondary | ICD-10-CM | POA: Diagnosis not present

## 2020-06-22 DIAGNOSIS — M9905 Segmental and somatic dysfunction of pelvic region: Secondary | ICD-10-CM | POA: Diagnosis not present

## 2020-06-22 DIAGNOSIS — M9902 Segmental and somatic dysfunction of thoracic region: Secondary | ICD-10-CM | POA: Diagnosis not present

## 2020-06-22 DIAGNOSIS — M5415 Radiculopathy, thoracolumbar region: Secondary | ICD-10-CM | POA: Diagnosis not present

## 2020-06-22 DIAGNOSIS — M9903 Segmental and somatic dysfunction of lumbar region: Secondary | ICD-10-CM | POA: Diagnosis not present

## 2020-07-13 ENCOUNTER — Ambulatory Visit: Payer: 59 | Attending: Internal Medicine

## 2020-07-13 DIAGNOSIS — Z23 Encounter for immunization: Secondary | ICD-10-CM

## 2020-07-13 NOTE — Progress Notes (Signed)
   Covid-19 Vaccination Clinic  Name:  Isac Lincks    MRN: [id number] DOB: [birth year]  07/13/2020  Mr. Schutt was observed post Covid-19 immunization for 15 minutes without incident. He was provided with Vaccine Information Sheet and instruction to access the V-Safe system. Vaccinated by [employer].  Mr. Nabor was instructed to call 911 with any severe reactions post vaccine: Marland Kitchen Difficulty breathing  . Swelling of face and throat  . A fast heartbeat  . A bad rash all over body  . Dizziness and weakness

## 2020-07-19 DIAGNOSIS — Z23 Encounter for immunization: Secondary | ICD-10-CM | POA: Diagnosis not present

## 2020-07-19 DIAGNOSIS — K50818 Crohn's disease of both small and large intestine with other complication: Secondary | ICD-10-CM | POA: Diagnosis not present

## 2020-07-19 DIAGNOSIS — F419 Anxiety disorder, unspecified: Secondary | ICD-10-CM | POA: Diagnosis not present

## 2020-07-24 DIAGNOSIS — Z13820 Encounter for screening for osteoporosis: Secondary | ICD-10-CM | POA: Diagnosis not present

## 2020-08-06 DIAGNOSIS — L638 Other alopecia areata: Secondary | ICD-10-CM | POA: Diagnosis not present

## 2020-08-07 DIAGNOSIS — K5010 Crohn's disease of large intestine without complications: Secondary | ICD-10-CM | POA: Diagnosis not present

## 2020-08-07 DIAGNOSIS — Z23 Encounter for immunization: Secondary | ICD-10-CM | POA: Diagnosis not present

## 2020-08-09 DIAGNOSIS — M530 Cervicocranial syndrome: Secondary | ICD-10-CM | POA: Diagnosis not present

## 2020-08-09 DIAGNOSIS — M9905 Segmental and somatic dysfunction of pelvic region: Secondary | ICD-10-CM | POA: Diagnosis not present

## 2020-08-09 DIAGNOSIS — M9901 Segmental and somatic dysfunction of cervical region: Secondary | ICD-10-CM | POA: Diagnosis not present

## 2020-08-09 DIAGNOSIS — M9903 Segmental and somatic dysfunction of lumbar region: Secondary | ICD-10-CM | POA: Diagnosis not present

## 2020-08-09 DIAGNOSIS — M9902 Segmental and somatic dysfunction of thoracic region: Secondary | ICD-10-CM | POA: Diagnosis not present

## 2020-08-09 DIAGNOSIS — M62830 Muscle spasm of back: Secondary | ICD-10-CM | POA: Diagnosis not present

## 2020-09-12 MED FILL — METHOTREXATE 2.5 MG TAB: 2.5 | 84 days supply | Qty: 72 | Fill #1

## 2020-09-12 MED FILL — FOLIC ACID 1 MG TABS: 1 | 90 days supply | Qty: 90 | Fill #1

## 2020-09-27 DIAGNOSIS — M62830 Muscle spasm of back: Secondary | ICD-10-CM | POA: Diagnosis not present

## 2020-09-27 DIAGNOSIS — M530 Cervicocranial syndrome: Secondary | ICD-10-CM | POA: Diagnosis not present

## 2020-09-27 DIAGNOSIS — M9903 Segmental and somatic dysfunction of lumbar region: Secondary | ICD-10-CM | POA: Diagnosis not present

## 2020-09-27 DIAGNOSIS — M9905 Segmental and somatic dysfunction of pelvic region: Secondary | ICD-10-CM | POA: Diagnosis not present

## 2020-09-27 DIAGNOSIS — M9901 Segmental and somatic dysfunction of cervical region: Secondary | ICD-10-CM | POA: Diagnosis not present

## 2020-09-27 DIAGNOSIS — M9902 Segmental and somatic dysfunction of thoracic region: Secondary | ICD-10-CM | POA: Diagnosis not present

## 2020-10-02 DIAGNOSIS — K5010 Crohn's disease of large intestine without complications: Secondary | ICD-10-CM | POA: Diagnosis not present

## 2020-10-05 DIAGNOSIS — M530 Cervicocranial syndrome: Secondary | ICD-10-CM | POA: Diagnosis not present

## 2020-10-05 DIAGNOSIS — M62830 Muscle spasm of back: Secondary | ICD-10-CM | POA: Diagnosis not present

## 2020-10-05 DIAGNOSIS — M9902 Segmental and somatic dysfunction of thoracic region: Secondary | ICD-10-CM | POA: Diagnosis not present

## 2020-10-05 DIAGNOSIS — M9903 Segmental and somatic dysfunction of lumbar region: Secondary | ICD-10-CM | POA: Diagnosis not present

## 2020-10-05 DIAGNOSIS — M9905 Segmental and somatic dysfunction of pelvic region: Secondary | ICD-10-CM | POA: Diagnosis not present

## 2020-10-05 DIAGNOSIS — M9901 Segmental and somatic dysfunction of cervical region: Secondary | ICD-10-CM | POA: Diagnosis not present

## 2020-10-15 ENCOUNTER — Other Ambulatory Visit (HOSPITAL_BASED_OUTPATIENT_CLINIC_OR_DEPARTMENT_OTHER): Payer: Self-pay | Admitting: Family Medicine

## 2020-10-15 DIAGNOSIS — D1801 Hemangioma of skin and subcutaneous tissue: Secondary | ICD-10-CM | POA: Diagnosis not present

## 2020-10-15 DIAGNOSIS — L700 Acne vulgaris: Secondary | ICD-10-CM | POA: Diagnosis not present

## 2020-10-15 DIAGNOSIS — D225 Melanocytic nevi of trunk: Secondary | ICD-10-CM | POA: Diagnosis not present

## 2020-10-15 DIAGNOSIS — L728 Other follicular cysts of the skin and subcutaneous tissue: Secondary | ICD-10-CM | POA: Diagnosis not present

## 2020-10-15 MED FILL — CLIND PH-BENZOYL PEROX 1.2-: 1.2-5 | 30 days supply | Qty: 45 | Fill #0

## 2020-10-18 ENCOUNTER — Other Ambulatory Visit (HOSPITAL_BASED_OUTPATIENT_CLINIC_OR_DEPARTMENT_OTHER): Payer: Self-pay | Admitting: Nurse Practitioner

## 2020-10-18 DIAGNOSIS — F902 Attention-deficit hyperactivity disorder, combined type: Secondary | ICD-10-CM | POA: Diagnosis not present

## 2020-10-18 DIAGNOSIS — F331 Major depressive disorder, recurrent, moderate: Secondary | ICD-10-CM | POA: Diagnosis not present

## 2020-10-18 DIAGNOSIS — F411 Generalized anxiety disorder: Secondary | ICD-10-CM | POA: Diagnosis not present

## 2020-10-18 MED FILL — SERTRALINE HCL 25 MG TABLET: 25 | 30 days supply | Qty: 67 | Fill #0

## 2020-10-31 DIAGNOSIS — K50818 Crohn's disease of both small and large intestine with other complication: Secondary | ICD-10-CM | POA: Diagnosis not present

## 2020-10-31 DIAGNOSIS — Z91128 Patient's intentional underdosing of medication regimen for other reason: Secondary | ICD-10-CM | POA: Diagnosis not present

## 2020-11-02 DIAGNOSIS — K50818 Crohn's disease of both small and large intestine with other complication: Secondary | ICD-10-CM | POA: Diagnosis not present

## 2020-11-02 DIAGNOSIS — Z713 Dietary counseling and surveillance: Secondary | ICD-10-CM | POA: Diagnosis not present

## 2020-11-02 DIAGNOSIS — Z6823 Body mass index (BMI) 23.0-23.9, adult: Secondary | ICD-10-CM | POA: Diagnosis not present

## 2020-11-22 ENCOUNTER — Other Ambulatory Visit (HOSPITAL_BASED_OUTPATIENT_CLINIC_OR_DEPARTMENT_OTHER): Payer: Self-pay | Admitting: Nurse Practitioner

## 2020-11-22 DIAGNOSIS — F902 Attention-deficit hyperactivity disorder, combined type: Secondary | ICD-10-CM | POA: Diagnosis not present

## 2020-11-22 DIAGNOSIS — F411 Generalized anxiety disorder: Secondary | ICD-10-CM | POA: Diagnosis not present

## 2020-11-22 DIAGNOSIS — F331 Major depressive disorder, recurrent, moderate: Secondary | ICD-10-CM | POA: Diagnosis not present

## 2020-11-22 MED FILL — SERTRALINE HCL 100 MG TABS: 100 | 30 days supply | Qty: 30 | Fill #0

## 2020-11-27 DIAGNOSIS — K5010 Crohn's disease of large intestine without complications: Secondary | ICD-10-CM | POA: Diagnosis not present

## 2020-11-27 DIAGNOSIS — K50818 Crohn's disease of both small and large intestine with other complication: Secondary | ICD-10-CM | POA: Diagnosis not present

## 2020-11-29 ENCOUNTER — Other Ambulatory Visit (HOSPITAL_BASED_OUTPATIENT_CLINIC_OR_DEPARTMENT_OTHER): Payer: Self-pay | Admitting: Gastroenterology

## 2020-11-29 DIAGNOSIS — K50818 Crohn's disease of both small and large intestine with other complication: Secondary | ICD-10-CM | POA: Diagnosis not present

## 2020-11-29 MED FILL — FERROUS SULFATE 325 MG TAB: 325 (65 FE) | 233 days supply | Qty: 100 | Fill #0

## 2020-12-03 DIAGNOSIS — R41840 Attention and concentration deficit: Secondary | ICD-10-CM | POA: Diagnosis not present

## 2020-12-19 DIAGNOSIS — F331 Major depressive disorder, recurrent, moderate: Secondary | ICD-10-CM | POA: Diagnosis not present

## 2020-12-19 DIAGNOSIS — F411 Generalized anxiety disorder: Secondary | ICD-10-CM | POA: Diagnosis not present

## 2020-12-19 DIAGNOSIS — F902 Attention-deficit hyperactivity disorder, combined type: Secondary | ICD-10-CM | POA: Diagnosis not present

## 2020-12-20 ENCOUNTER — Other Ambulatory Visit (HOSPITAL_COMMUNITY): Payer: Self-pay | Admitting: Nurse Practitioner

## 2020-12-20 MED FILL — AMPHETAMINE-DEXTROAMPHETAMI: 5 | 30 days supply | Qty: 60 | Fill #0

## 2020-12-26 DIAGNOSIS — L700 Acne vulgaris: Secondary | ICD-10-CM | POA: Diagnosis not present

## 2021-01-01 DIAGNOSIS — F331 Major depressive disorder, recurrent, moderate: Secondary | ICD-10-CM | POA: Diagnosis not present

## 2021-01-01 DIAGNOSIS — F902 Attention-deficit hyperactivity disorder, combined type: Secondary | ICD-10-CM | POA: Diagnosis not present

## 2021-01-01 DIAGNOSIS — F411 Generalized anxiety disorder: Secondary | ICD-10-CM | POA: Diagnosis not present

## 2021-01-07 DIAGNOSIS — M9901 Segmental and somatic dysfunction of cervical region: Secondary | ICD-10-CM | POA: Diagnosis not present

## 2021-01-07 DIAGNOSIS — M9903 Segmental and somatic dysfunction of lumbar region: Secondary | ICD-10-CM | POA: Diagnosis not present

## 2021-01-07 DIAGNOSIS — M9902 Segmental and somatic dysfunction of thoracic region: Secondary | ICD-10-CM | POA: Diagnosis not present

## 2021-01-07 DIAGNOSIS — M9908 Segmental and somatic dysfunction of rib cage: Secondary | ICD-10-CM | POA: Diagnosis not present

## 2021-01-07 DIAGNOSIS — M531 Cervicobrachial syndrome: Secondary | ICD-10-CM | POA: Diagnosis not present

## 2021-01-07 DIAGNOSIS — M62830 Muscle spasm of back: Secondary | ICD-10-CM | POA: Diagnosis not present

## 2021-01-07 DIAGNOSIS — M9905 Segmental and somatic dysfunction of pelvic region: Secondary | ICD-10-CM | POA: Diagnosis not present

## 2021-01-14 DIAGNOSIS — M9901 Segmental and somatic dysfunction of cervical region: Secondary | ICD-10-CM | POA: Diagnosis not present

## 2021-01-14 DIAGNOSIS — M9908 Segmental and somatic dysfunction of rib cage: Secondary | ICD-10-CM | POA: Diagnosis not present

## 2021-01-14 DIAGNOSIS — M9903 Segmental and somatic dysfunction of lumbar region: Secondary | ICD-10-CM | POA: Diagnosis not present

## 2021-01-14 DIAGNOSIS — M531 Cervicobrachial syndrome: Secondary | ICD-10-CM | POA: Diagnosis not present

## 2021-01-14 DIAGNOSIS — M62830 Muscle spasm of back: Secondary | ICD-10-CM | POA: Diagnosis not present

## 2021-01-14 DIAGNOSIS — M9902 Segmental and somatic dysfunction of thoracic region: Secondary | ICD-10-CM | POA: Diagnosis not present

## 2021-01-14 DIAGNOSIS — M9905 Segmental and somatic dysfunction of pelvic region: Secondary | ICD-10-CM | POA: Diagnosis not present

## 2021-01-17 ENCOUNTER — Other Ambulatory Visit (HOSPITAL_BASED_OUTPATIENT_CLINIC_OR_DEPARTMENT_OTHER): Payer: Self-pay | Admitting: Family Medicine

## 2021-01-17 MED FILL — FINASTERIDE 1 MG TABS: 1 | 30 days supply | Qty: 30 | Fill #0

## 2021-01-17 MED FILL — buPROPion HCL ER (XL) 150 M: 150 | 30 days supply | Qty: 30 | Fill #0

## 2021-01-21 DIAGNOSIS — M9908 Segmental and somatic dysfunction of rib cage: Secondary | ICD-10-CM | POA: Diagnosis not present

## 2021-01-21 DIAGNOSIS — M531 Cervicobrachial syndrome: Secondary | ICD-10-CM | POA: Diagnosis not present

## 2021-01-21 DIAGNOSIS — M9905 Segmental and somatic dysfunction of pelvic region: Secondary | ICD-10-CM | POA: Diagnosis not present

## 2021-01-21 DIAGNOSIS — M9903 Segmental and somatic dysfunction of lumbar region: Secondary | ICD-10-CM | POA: Diagnosis not present

## 2021-01-21 DIAGNOSIS — M9901 Segmental and somatic dysfunction of cervical region: Secondary | ICD-10-CM | POA: Diagnosis not present

## 2021-01-21 DIAGNOSIS — M9902 Segmental and somatic dysfunction of thoracic region: Secondary | ICD-10-CM | POA: Diagnosis not present

## 2021-01-21 DIAGNOSIS — M62830 Muscle spasm of back: Secondary | ICD-10-CM | POA: Diagnosis not present

## 2021-01-22 DIAGNOSIS — K5010 Crohn's disease of large intestine without complications: Secondary | ICD-10-CM | POA: Diagnosis not present

## 2021-01-29 ENCOUNTER — Other Ambulatory Visit (HOSPITAL_BASED_OUTPATIENT_CLINIC_OR_DEPARTMENT_OTHER): Payer: Self-pay

## 2021-01-29 DIAGNOSIS — F411 Generalized anxiety disorder: Secondary | ICD-10-CM | POA: Diagnosis not present

## 2021-01-29 DIAGNOSIS — F331 Major depressive disorder, recurrent, moderate: Secondary | ICD-10-CM | POA: Diagnosis not present

## 2021-01-29 DIAGNOSIS — F902 Attention-deficit hyperactivity disorder, combined type: Secondary | ICD-10-CM | POA: Diagnosis not present

## 2021-01-29 MED ORDER — BUPROPION HCL ER (XL) 300 MG PO TB24
ORAL_TABLET | ORAL | 2 refills | Status: DC
  Filled 2021-01-29: qty 30, 30d supply, fill #0
  Filled 2021-02-25: qty 30, 30d supply, fill #1

## 2021-02-04 DIAGNOSIS — M531 Cervicobrachial syndrome: Secondary | ICD-10-CM | POA: Diagnosis not present

## 2021-02-04 DIAGNOSIS — M9902 Segmental and somatic dysfunction of thoracic region: Secondary | ICD-10-CM | POA: Diagnosis not present

## 2021-02-04 DIAGNOSIS — M9901 Segmental and somatic dysfunction of cervical region: Secondary | ICD-10-CM | POA: Diagnosis not present

## 2021-02-04 DIAGNOSIS — M9908 Segmental and somatic dysfunction of rib cage: Secondary | ICD-10-CM | POA: Diagnosis not present

## 2021-02-04 DIAGNOSIS — M62830 Muscle spasm of back: Secondary | ICD-10-CM | POA: Diagnosis not present

## 2021-02-04 DIAGNOSIS — M9903 Segmental and somatic dysfunction of lumbar region: Secondary | ICD-10-CM | POA: Diagnosis not present

## 2021-02-04 DIAGNOSIS — M9905 Segmental and somatic dysfunction of pelvic region: Secondary | ICD-10-CM | POA: Diagnosis not present

## 2021-02-06 DIAGNOSIS — K50818 Crohn's disease of both small and large intestine with other complication: Secondary | ICD-10-CM | POA: Diagnosis not present

## 2021-02-11 DIAGNOSIS — M9901 Segmental and somatic dysfunction of cervical region: Secondary | ICD-10-CM | POA: Diagnosis not present

## 2021-02-11 DIAGNOSIS — M9903 Segmental and somatic dysfunction of lumbar region: Secondary | ICD-10-CM | POA: Diagnosis not present

## 2021-02-11 DIAGNOSIS — M9905 Segmental and somatic dysfunction of pelvic region: Secondary | ICD-10-CM | POA: Diagnosis not present

## 2021-02-11 DIAGNOSIS — M9902 Segmental and somatic dysfunction of thoracic region: Secondary | ICD-10-CM | POA: Diagnosis not present

## 2021-02-11 DIAGNOSIS — M9908 Segmental and somatic dysfunction of rib cage: Secondary | ICD-10-CM | POA: Diagnosis not present

## 2021-02-11 DIAGNOSIS — M62830 Muscle spasm of back: Secondary | ICD-10-CM | POA: Diagnosis not present

## 2021-02-11 DIAGNOSIS — M531 Cervicobrachial syndrome: Secondary | ICD-10-CM | POA: Diagnosis not present

## 2021-02-13 ENCOUNTER — Other Ambulatory Visit (HOSPITAL_BASED_OUTPATIENT_CLINIC_OR_DEPARTMENT_OTHER): Payer: Self-pay

## 2021-02-13 MED FILL — Finasteride Tab 1 MG: ORAL | 30 days supply | Qty: 30 | Fill #0 | Status: AC

## 2021-02-15 ENCOUNTER — Other Ambulatory Visit (HOSPITAL_BASED_OUTPATIENT_CLINIC_OR_DEPARTMENT_OTHER): Payer: Self-pay

## 2021-02-25 ENCOUNTER — Other Ambulatory Visit (HOSPITAL_BASED_OUTPATIENT_CLINIC_OR_DEPARTMENT_OTHER): Payer: Self-pay

## 2021-02-25 DIAGNOSIS — M531 Cervicobrachial syndrome: Secondary | ICD-10-CM | POA: Diagnosis not present

## 2021-02-25 DIAGNOSIS — M9905 Segmental and somatic dysfunction of pelvic region: Secondary | ICD-10-CM | POA: Diagnosis not present

## 2021-02-25 DIAGNOSIS — M9901 Segmental and somatic dysfunction of cervical region: Secondary | ICD-10-CM | POA: Diagnosis not present

## 2021-02-25 DIAGNOSIS — M9902 Segmental and somatic dysfunction of thoracic region: Secondary | ICD-10-CM | POA: Diagnosis not present

## 2021-02-25 DIAGNOSIS — M62830 Muscle spasm of back: Secondary | ICD-10-CM | POA: Diagnosis not present

## 2021-02-25 DIAGNOSIS — M9903 Segmental and somatic dysfunction of lumbar region: Secondary | ICD-10-CM | POA: Diagnosis not present

## 2021-02-25 DIAGNOSIS — M9908 Segmental and somatic dysfunction of rib cage: Secondary | ICD-10-CM | POA: Diagnosis not present

## 2021-02-26 ENCOUNTER — Other Ambulatory Visit (HOSPITAL_BASED_OUTPATIENT_CLINIC_OR_DEPARTMENT_OTHER): Payer: Self-pay

## 2021-02-26 DIAGNOSIS — F331 Major depressive disorder, recurrent, moderate: Secondary | ICD-10-CM | POA: Diagnosis not present

## 2021-02-26 DIAGNOSIS — F411 Generalized anxiety disorder: Secondary | ICD-10-CM | POA: Diagnosis not present

## 2021-02-26 DIAGNOSIS — F902 Attention-deficit hyperactivity disorder, combined type: Secondary | ICD-10-CM | POA: Diagnosis not present

## 2021-02-26 MED ORDER — METHYLPHENIDATE HCL ER (OSM) 27 MG PO TBCR
27.0000 mg | EXTENDED_RELEASE_TABLET | Freq: Every day | ORAL | 0 refills | Status: DC
  Filled 2021-02-26: qty 30, 30d supply, fill #0

## 2021-02-26 MED ORDER — SERTRALINE HCL 50 MG PO TABS
ORAL_TABLET | ORAL | 0 refills | Status: DC
  Filled 2021-02-26: qty 15, 15d supply, fill #0

## 2021-02-26 MED ORDER — SERTRALINE HCL 100 MG PO TABS
1.0000 | ORAL_TABLET | Freq: Every day | ORAL | 2 refills | Status: DC
  Filled 2021-02-26: qty 30, 30d supply, fill #0
  Filled 2021-04-08: qty 30, 30d supply, fill #1
  Filled 2021-05-14: qty 30, 30d supply, fill #2

## 2021-03-15 ENCOUNTER — Other Ambulatory Visit (HOSPITAL_BASED_OUTPATIENT_CLINIC_OR_DEPARTMENT_OTHER): Payer: Self-pay

## 2021-03-15 MED FILL — Finasteride Tab 1 MG: ORAL | 30 days supply | Qty: 30 | Fill #1 | Status: AC

## 2021-03-19 DIAGNOSIS — K5010 Crohn's disease of large intestine without complications: Secondary | ICD-10-CM | POA: Diagnosis not present

## 2021-03-25 ENCOUNTER — Other Ambulatory Visit (HOSPITAL_BASED_OUTPATIENT_CLINIC_OR_DEPARTMENT_OTHER): Payer: Self-pay

## 2021-03-27 ENCOUNTER — Other Ambulatory Visit (HOSPITAL_BASED_OUTPATIENT_CLINIC_OR_DEPARTMENT_OTHER): Payer: Self-pay

## 2021-03-28 ENCOUNTER — Other Ambulatory Visit (HOSPITAL_BASED_OUTPATIENT_CLINIC_OR_DEPARTMENT_OTHER): Payer: Self-pay

## 2021-03-28 MED ORDER — METHYLPHENIDATE HCL ER (OSM) 27 MG PO TBCR
EXTENDED_RELEASE_TABLET | ORAL | 0 refills | Status: DC
  Filled 2021-03-28: qty 30, 30d supply, fill #0

## 2021-04-01 ENCOUNTER — Other Ambulatory Visit (HOSPITAL_BASED_OUTPATIENT_CLINIC_OR_DEPARTMENT_OTHER): Payer: Self-pay

## 2021-04-03 DIAGNOSIS — F331 Major depressive disorder, recurrent, moderate: Secondary | ICD-10-CM | POA: Diagnosis not present

## 2021-04-03 DIAGNOSIS — F411 Generalized anxiety disorder: Secondary | ICD-10-CM | POA: Diagnosis not present

## 2021-04-03 DIAGNOSIS — F902 Attention-deficit hyperactivity disorder, combined type: Secondary | ICD-10-CM | POA: Diagnosis not present

## 2021-04-08 ENCOUNTER — Other Ambulatory Visit (HOSPITAL_BASED_OUTPATIENT_CLINIC_OR_DEPARTMENT_OTHER): Payer: Self-pay

## 2021-04-16 ENCOUNTER — Other Ambulatory Visit (HOSPITAL_BASED_OUTPATIENT_CLINIC_OR_DEPARTMENT_OTHER): Payer: Self-pay

## 2021-04-16 MED FILL — Finasteride Tab 1 MG: ORAL | 30 days supply | Qty: 30 | Fill #2 | Status: AC

## 2021-04-26 ENCOUNTER — Other Ambulatory Visit (HOSPITAL_BASED_OUTPATIENT_CLINIC_OR_DEPARTMENT_OTHER): Payer: Self-pay

## 2021-04-30 ENCOUNTER — Other Ambulatory Visit (HOSPITAL_BASED_OUTPATIENT_CLINIC_OR_DEPARTMENT_OTHER): Payer: Self-pay

## 2021-04-30 MED ORDER — METHYLPHENIDATE HCL ER (OSM) 27 MG PO TBCR
EXTENDED_RELEASE_TABLET | ORAL | 0 refills | Status: DC
  Filled 2021-04-30: qty 30, 30d supply, fill #0

## 2021-05-09 DIAGNOSIS — F331 Major depressive disorder, recurrent, moderate: Secondary | ICD-10-CM | POA: Diagnosis not present

## 2021-05-09 DIAGNOSIS — F411 Generalized anxiety disorder: Secondary | ICD-10-CM | POA: Diagnosis not present

## 2021-05-09 DIAGNOSIS — F902 Attention-deficit hyperactivity disorder, combined type: Secondary | ICD-10-CM | POA: Diagnosis not present

## 2021-05-14 ENCOUNTER — Other Ambulatory Visit (HOSPITAL_BASED_OUTPATIENT_CLINIC_OR_DEPARTMENT_OTHER): Payer: Self-pay

## 2021-05-14 DIAGNOSIS — K5010 Crohn's disease of large intestine without complications: Secondary | ICD-10-CM | POA: Diagnosis not present

## 2021-05-20 ENCOUNTER — Other Ambulatory Visit (HOSPITAL_BASED_OUTPATIENT_CLINIC_OR_DEPARTMENT_OTHER): Payer: Self-pay

## 2021-05-20 MED FILL — Finasteride Tab 1 MG: ORAL | 30 days supply | Qty: 30 | Fill #3 | Status: AC

## 2021-05-23 ENCOUNTER — Other Ambulatory Visit (HOSPITAL_BASED_OUTPATIENT_CLINIC_OR_DEPARTMENT_OTHER): Payer: Self-pay

## 2021-05-23 DIAGNOSIS — L209 Atopic dermatitis, unspecified: Secondary | ICD-10-CM | POA: Diagnosis not present

## 2021-05-23 MED ORDER — TRIAMCINOLONE ACETONIDE 0.1 % EX CREA
TOPICAL_CREAM | CUTANEOUS | 2 refills | Status: DC
Start: 2021-05-23 — End: 2021-08-05
  Filled 2021-05-23: qty 75, 30d supply, fill #0

## 2021-05-28 ENCOUNTER — Other Ambulatory Visit (HOSPITAL_BASED_OUTPATIENT_CLINIC_OR_DEPARTMENT_OTHER): Payer: Self-pay

## 2021-05-28 DIAGNOSIS — F331 Major depressive disorder, recurrent, moderate: Secondary | ICD-10-CM | POA: Diagnosis not present

## 2021-05-28 DIAGNOSIS — F411 Generalized anxiety disorder: Secondary | ICD-10-CM | POA: Diagnosis not present

## 2021-05-28 DIAGNOSIS — F902 Attention-deficit hyperactivity disorder, combined type: Secondary | ICD-10-CM | POA: Diagnosis not present

## 2021-05-28 MED ORDER — METHYLPHENIDATE HCL ER (OSM) 36 MG PO TBCR
EXTENDED_RELEASE_TABLET | ORAL | 0 refills | Status: DC
  Filled 2021-05-29: qty 30, 30d supply, fill #0

## 2021-05-29 ENCOUNTER — Other Ambulatory Visit (HOSPITAL_BASED_OUTPATIENT_CLINIC_OR_DEPARTMENT_OTHER): Payer: Self-pay

## 2021-06-10 DIAGNOSIS — F902 Attention-deficit hyperactivity disorder, combined type: Secondary | ICD-10-CM | POA: Diagnosis not present

## 2021-06-10 DIAGNOSIS — F411 Generalized anxiety disorder: Secondary | ICD-10-CM | POA: Diagnosis not present

## 2021-06-10 DIAGNOSIS — F331 Major depressive disorder, recurrent, moderate: Secondary | ICD-10-CM | POA: Diagnosis not present

## 2021-06-11 DIAGNOSIS — F902 Attention-deficit hyperactivity disorder, combined type: Secondary | ICD-10-CM | POA: Diagnosis not present

## 2021-06-11 DIAGNOSIS — F331 Major depressive disorder, recurrent, moderate: Secondary | ICD-10-CM | POA: Diagnosis not present

## 2021-06-11 DIAGNOSIS — F411 Generalized anxiety disorder: Secondary | ICD-10-CM | POA: Diagnosis not present

## 2021-06-18 ENCOUNTER — Other Ambulatory Visit (HOSPITAL_BASED_OUTPATIENT_CLINIC_OR_DEPARTMENT_OTHER): Payer: Self-pay

## 2021-06-18 DIAGNOSIS — F411 Generalized anxiety disorder: Secondary | ICD-10-CM | POA: Diagnosis not present

## 2021-06-18 DIAGNOSIS — F902 Attention-deficit hyperactivity disorder, combined type: Secondary | ICD-10-CM | POA: Diagnosis not present

## 2021-06-18 MED FILL — Finasteride Tab 1 MG: ORAL | 30 days supply | Qty: 30 | Fill #4 | Status: AC

## 2021-06-20 DIAGNOSIS — K5010 Crohn's disease of large intestine without complications: Secondary | ICD-10-CM | POA: Diagnosis not present

## 2021-06-20 DIAGNOSIS — K50818 Crohn's disease of both small and large intestine with other complication: Secondary | ICD-10-CM | POA: Diagnosis not present

## 2021-06-27 ENCOUNTER — Other Ambulatory Visit (HOSPITAL_BASED_OUTPATIENT_CLINIC_OR_DEPARTMENT_OTHER): Payer: Self-pay

## 2021-06-28 ENCOUNTER — Other Ambulatory Visit (HOSPITAL_BASED_OUTPATIENT_CLINIC_OR_DEPARTMENT_OTHER): Payer: Self-pay

## 2021-06-28 MED ORDER — METHYLPHENIDATE HCL ER (OSM) 36 MG PO TBCR
EXTENDED_RELEASE_TABLET | ORAL | 0 refills | Status: DC
  Filled 2021-06-28: qty 30, 30d supply, fill #0

## 2021-07-02 DIAGNOSIS — F331 Major depressive disorder, recurrent, moderate: Secondary | ICD-10-CM | POA: Diagnosis not present

## 2021-07-02 DIAGNOSIS — F411 Generalized anxiety disorder: Secondary | ICD-10-CM | POA: Diagnosis not present

## 2021-07-02 DIAGNOSIS — F902 Attention-deficit hyperactivity disorder, combined type: Secondary | ICD-10-CM | POA: Diagnosis not present

## 2021-07-05 DIAGNOSIS — F902 Attention-deficit hyperactivity disorder, combined type: Secondary | ICD-10-CM | POA: Diagnosis not present

## 2021-07-05 DIAGNOSIS — F411 Generalized anxiety disorder: Secondary | ICD-10-CM | POA: Diagnosis not present

## 2021-07-05 DIAGNOSIS — E291 Testicular hypofunction: Secondary | ICD-10-CM | POA: Diagnosis not present

## 2021-07-05 DIAGNOSIS — F331 Major depressive disorder, recurrent, moderate: Secondary | ICD-10-CM | POA: Diagnosis not present

## 2021-07-08 ENCOUNTER — Other Ambulatory Visit (HOSPITAL_BASED_OUTPATIENT_CLINIC_OR_DEPARTMENT_OTHER): Payer: Self-pay

## 2021-07-08 MED ORDER — VILAZODONE HCL 40 MG PO TABS
ORAL_TABLET | Freq: Every day | ORAL | 2 refills | Status: AC
  Filled 2021-07-08 – 2021-07-09 (×2): qty 90, 90d supply, fill #0
  Filled 2021-10-08: qty 90, 90d supply, fill #1
  Filled 2022-01-01: qty 90, 90d supply, fill #2

## 2021-07-09 ENCOUNTER — Other Ambulatory Visit (HOSPITAL_BASED_OUTPATIENT_CLINIC_OR_DEPARTMENT_OTHER): Payer: Self-pay

## 2021-07-09 DIAGNOSIS — K5010 Crohn's disease of large intestine without complications: Secondary | ICD-10-CM | POA: Diagnosis not present

## 2021-07-16 DIAGNOSIS — F331 Major depressive disorder, recurrent, moderate: Secondary | ICD-10-CM | POA: Diagnosis not present

## 2021-07-16 DIAGNOSIS — F902 Attention-deficit hyperactivity disorder, combined type: Secondary | ICD-10-CM | POA: Diagnosis not present

## 2021-07-16 DIAGNOSIS — H5203 Hypermetropia, bilateral: Secondary | ICD-10-CM | POA: Diagnosis not present

## 2021-07-16 DIAGNOSIS — F411 Generalized anxiety disorder: Secondary | ICD-10-CM | POA: Diagnosis not present

## 2021-07-19 ENCOUNTER — Other Ambulatory Visit (HOSPITAL_BASED_OUTPATIENT_CLINIC_OR_DEPARTMENT_OTHER): Payer: Self-pay

## 2021-07-25 ENCOUNTER — Other Ambulatory Visit (HOSPITAL_BASED_OUTPATIENT_CLINIC_OR_DEPARTMENT_OTHER): Payer: Self-pay

## 2021-07-29 ENCOUNTER — Other Ambulatory Visit (HOSPITAL_BASED_OUTPATIENT_CLINIC_OR_DEPARTMENT_OTHER): Payer: Self-pay

## 2021-07-29 MED ORDER — METHYLPHENIDATE HCL ER (OSM) 36 MG PO TBCR
EXTENDED_RELEASE_TABLET | ORAL | 0 refills | Status: DC
  Filled 2021-07-29 – 2021-07-30 (×2): qty 30, 30d supply, fill #0

## 2021-07-30 ENCOUNTER — Other Ambulatory Visit (HOSPITAL_BASED_OUTPATIENT_CLINIC_OR_DEPARTMENT_OTHER): Payer: Self-pay

## 2021-07-30 DIAGNOSIS — F331 Major depressive disorder, recurrent, moderate: Secondary | ICD-10-CM | POA: Diagnosis not present

## 2021-07-30 DIAGNOSIS — F411 Generalized anxiety disorder: Secondary | ICD-10-CM | POA: Diagnosis not present

## 2021-07-30 DIAGNOSIS — F902 Attention-deficit hyperactivity disorder, combined type: Secondary | ICD-10-CM | POA: Diagnosis not present

## 2021-08-01 ENCOUNTER — Other Ambulatory Visit (HOSPITAL_BASED_OUTPATIENT_CLINIC_OR_DEPARTMENT_OTHER): Payer: Self-pay

## 2021-08-01 MED ORDER — FINASTERIDE 1 MG PO TABS
1.0000 mg | ORAL_TABLET | Freq: Every day | ORAL | 1 refills | Status: AC
  Filled 2021-08-01: qty 30, 30d supply, fill #0
  Filled 2021-08-27: qty 30, 30d supply, fill #1

## 2021-08-05 ENCOUNTER — Other Ambulatory Visit: Payer: Self-pay

## 2021-08-05 ENCOUNTER — Emergency Department (INDEPENDENT_AMBULATORY_CARE_PROVIDER_SITE_OTHER)
Admission: EM | Admit: 2021-08-05 | Discharge: 2021-08-05 | Disposition: A | Discharge status: Home / Self Care | Payer: 59 | Source: Home / Self Care | Attending: Family Medicine | Admitting: Family Medicine

## 2021-08-05 ENCOUNTER — Other Ambulatory Visit (HOSPITAL_BASED_OUTPATIENT_CLINIC_OR_DEPARTMENT_OTHER): Payer: Self-pay

## 2021-08-05 DIAGNOSIS — J069 Acute upper respiratory infection, unspecified: Secondary | ICD-10-CM

## 2021-08-05 DIAGNOSIS — L649 Androgenic alopecia, unspecified: Secondary | ICD-10-CM | POA: Diagnosis not present

## 2021-08-05 DIAGNOSIS — H73012 Bullous myringitis, left ear: Secondary | ICD-10-CM

## 2021-08-05 DIAGNOSIS — L209 Atopic dermatitis, unspecified: Secondary | ICD-10-CM | POA: Diagnosis not present

## 2021-08-05 HISTORY — DX: Attention-deficit hyperactivity disorder, unspecified type: F90.9

## 2021-08-05 HISTORY — DX: Obsessive-compulsive disorder, unspecified: F42.9

## 2021-08-05 HISTORY — DX: Other specified behavioral and emotional disorders with onset usually occurring in childhood and adolescence: F98.8

## 2021-08-05 HISTORY — DX: Anxiety disorder, unspecified: F41.9

## 2021-08-05 HISTORY — DX: Crohn's disease, unspecified, without complications: K50.90

## 2021-08-05 MED ORDER — CLARITHROMYCIN 500 MG PO TABS
500.0000 mg | ORAL_TABLET | Freq: Two times a day (BID) | ORAL | 0 refills | Status: AC
  Filled 2021-08-05: qty 20, 10d supply, fill #0

## 2021-08-05 NOTE — ED Provider Notes (Signed)
[name]    CSN: [id number] Arrival date & time: 08/05/21  2563      History   Chief Complaint Chief Complaint  Patient presents with   Sore Throat   Nasal Congestion    HPI Todd Webb is a 25 y.o. male.   HPI  Todd Webb is here for an upper respiratory infection.  Is been present for 5 days.  He has cough, cold, sinus congestion, postnasal drip, ear pressure and pain, swollen lymph nodes.  Symptoms are bilateral.  No fever or chills.  COVID testing at home was negative  Past Medical History:  Diagnosis Date   ADD (attention deficit disorder)    ADHD    Anxiety    Crohn's disease ([place])    OCD (obsessive compulsive disorder)     There are no problems to display for this patient.   History reviewed. No pertinent surgical history.     Home Medications    Prior to Admission medications   Medication Sig Start Date End Date Taking? Authorizing Provider  clarithromycin (BIAXIN) 500 MG tablet Take 1 tablet (500 mg total) by mouth 2 (two) times daily. 08/05/21  Yes Raylene Everts, MD  inFLIXimab-dyyb (INFLECTRA IV) Inject into the vein.   Yes [provider]  finasteride (PROPECIA) 1 MG tablet Take 1 tablet (1 mg total) by mouth daily. 08/01/21     methylphenidate 36 MG PO CR tablet Take 1 tablet by mouth every morning 05/29/21     Vilazodone HCl (VIIBRYD) 40 MG TABS Take 1 tablet by mouth daily 07/08/21       Family History Family History  Problem Relation Age of Onset   Hypertension Mother    Hypertension Father     Social History Social History   Tobacco Use   Smoking status: Never   Smokeless tobacco: Never  Vaping Use   Vaping Use: Never used  Substance Use Topics   Alcohol use: No   Drug use: Never     Allergies   Augmentin [amoxicillin-pot clavulanate]   Review of Systems Review of Systems See HPI  Physical Exam Triage Vital Signs ED Triage Vitals  Enc Vitals Group     BP 08/05/21 0950 131/85     Pulse  Rate 08/05/21 0950 90     Resp 08/05/21 0950 14     Temp 08/05/21 0950 98.9 F (37.2 C)     Temp Source 08/05/21 0950 Oral     SpO2 08/05/21 0950 98 %     Weight --      Height --      Head Circumference --      Peak Flow --      Pain Score 08/05/21 0951 1     Pain Loc --      Pain Edu? --      Excl. in [place]? --    No data found.  Updated Vital Signs BP 131/85 (BP Location: Left Arm)   Pulse 90   Temp 98.9 F (37.2 C) (Oral)   Resp 14   SpO2 98%      Physical Exam Constitutional:      General: He is not in acute distress.    Appearance: He is well-developed and normal weight.  HENT:     Head: Normocephalic and atraumatic.     Right Ear: Tympanic membrane, ear canal and external ear normal.     Left Ear: Ear canal and external ear normal.     Ears:  Comments: Left TM is injected.  Dull.  Bullous formation noted    Nose: Congestion present. No rhinorrhea.     Mouth/Throat:     Pharynx: Posterior oropharyngeal erythema present.  Eyes:     Conjunctiva/sclera: Conjunctivae normal.     Pupils: Pupils are equal, round, and reactive to light.  Cardiovascular:     Rate and Rhythm: Normal rate and regular rhythm.     Heart sounds: Normal heart sounds.  Pulmonary:     Effort: Pulmonary effort is normal. No respiratory distress.     Breath sounds: Normal breath sounds.  Abdominal:     General: There is no distension.     Palpations: Abdomen is soft.  Musculoskeletal:        General: Normal range of motion.     Cervical back: Normal range of motion.  Lymphadenopathy:     Cervical: Cervical adenopathy present.  Skin:    General: Skin is warm and dry.  Neurological:     Mental Status: He is alert.     UC Treatments / Results  Labs (all labs ordered are listed, but only abnormal results are displayed) Labs Reviewed - No data to display  EKG   Radiology No results found.  Procedures Procedures (including critical care time)  Medications Ordered in  UC Medications - No data to display  Initial Impression / Assessment and Plan / UC Course  I have reviewed the triage vital signs and the nursing notes.  Pertinent labs & imaging results that were available during my care of the patient were reviewed by me and considered in my medical decision making (see chart for details).     Patient has a bulla on his left TM with injection.  The area is painful.  We will treat with clairthromycin given his penicillin allergy. Final Clinical Impressions(s) / UC Diagnoses   Final diagnoses:  Viral upper respiratory tract infection  Bullous myringitis, left ear     Discharge Instructions      Drink lots of fluids Take the antibiotic clarithromycin 2 times a day for 10 days for the ear infection May use over-the-counter cough and cold medicines as needed Return if not improving in 2 to 3 days   ED Prescriptions     Medication Sig Dispense Auth. Provider   clarithromycin (BIAXIN) 500 MG tablet Take 1 tablet (500 mg total) by mouth 2 (two) times daily. 20 tablet Raylene Everts, MD      PDMP not reviewed this encounter.   Raylene Everts, MD 08/05/21 1049

## 2021-08-05 NOTE — ED Triage Notes (Signed)
T presents with c/o sore throat and congestion x3 days. Home covid test negative. No fevers.

## 2021-08-05 NOTE — Discharge Instructions (Addendum)
Drink lots of fluids Take the antibiotic clarithromycin 2 times a day for 10 days for the ear infection May use over-the-counter cough and cold medicines as needed Return if not improving in 2 to 3 days

## 2021-08-06 ENCOUNTER — Other Ambulatory Visit (HOSPITAL_BASED_OUTPATIENT_CLINIC_OR_DEPARTMENT_OTHER): Payer: Self-pay

## 2021-08-06 DIAGNOSIS — M542 Cervicalgia: Secondary | ICD-10-CM | POA: Diagnosis not present

## 2021-08-06 DIAGNOSIS — M25512 Pain in left shoulder: Secondary | ICD-10-CM | POA: Diagnosis not present

## 2021-08-06 MED ORDER — TRIAMCINOLONE ACETONIDE 0.1 % EX CREA
1.0000 "application " | TOPICAL_CREAM | Freq: Two times a day (BID) | CUTANEOUS | 3 refills | Status: AC | PRN
  Filled 2021-08-06: qty 75, 30d supply, fill #0

## 2021-08-06 MED ORDER — FINASTERIDE 1 MG PO TABS
1.0000 mg | ORAL_TABLET | Freq: Every day | ORAL | 12 refills | Status: AC
  Filled 2021-09-26: qty 30, 30d supply, fill #0
  Filled 2022-04-28: qty 30, 30d supply, fill #1
  Filled 2022-05-26: qty 30, 30d supply, fill #2

## 2021-08-08 ENCOUNTER — Other Ambulatory Visit (HOSPITAL_BASED_OUTPATIENT_CLINIC_OR_DEPARTMENT_OTHER): Payer: Self-pay

## 2021-08-08 MED ORDER — MELOXICAM 7.5 MG PO TABS
7.5000 mg | ORAL_TABLET | ORAL | 2 refills | Status: DC
  Filled 2021-08-08: qty 60, 30d supply, fill #0
  Filled 2021-09-05: qty 60, 30d supply, fill #1
  Filled 2021-11-01: qty 60, 30d supply, fill #2

## 2021-08-13 DIAGNOSIS — M25512 Pain in left shoulder: Secondary | ICD-10-CM | POA: Diagnosis not present

## 2021-08-13 DIAGNOSIS — M542 Cervicalgia: Secondary | ICD-10-CM | POA: Diagnosis not present

## 2021-08-20 DIAGNOSIS — M25512 Pain in left shoulder: Secondary | ICD-10-CM | POA: Diagnosis not present

## 2021-08-20 DIAGNOSIS — M542 Cervicalgia: Secondary | ICD-10-CM | POA: Diagnosis not present

## 2021-08-20 DIAGNOSIS — F411 Generalized anxiety disorder: Secondary | ICD-10-CM | POA: Diagnosis not present

## 2021-08-28 ENCOUNTER — Other Ambulatory Visit (HOSPITAL_BASED_OUTPATIENT_CLINIC_OR_DEPARTMENT_OTHER): Payer: Self-pay

## 2021-08-29 DIAGNOSIS — M542 Cervicalgia: Secondary | ICD-10-CM | POA: Diagnosis not present

## 2021-08-29 DIAGNOSIS — M25512 Pain in left shoulder: Secondary | ICD-10-CM | POA: Diagnosis not present

## 2021-08-30 ENCOUNTER — Other Ambulatory Visit (HOSPITAL_BASED_OUTPATIENT_CLINIC_OR_DEPARTMENT_OTHER): Payer: Self-pay

## 2021-09-02 ENCOUNTER — Other Ambulatory Visit (HOSPITAL_BASED_OUTPATIENT_CLINIC_OR_DEPARTMENT_OTHER): Payer: Self-pay

## 2021-09-02 MED ORDER — METHYLPHENIDATE HCL ER (OSM) 36 MG PO TBCR
36.0000 mg | EXTENDED_RELEASE_TABLET | Freq: Every morning | ORAL | 0 refills | Status: AC
  Filled 2021-09-02: qty 30, 30d supply, fill #0

## 2021-09-03 DIAGNOSIS — K5010 Crohn's disease of large intestine without complications: Secondary | ICD-10-CM | POA: Diagnosis not present

## 2021-09-05 DIAGNOSIS — F411 Generalized anxiety disorder: Secondary | ICD-10-CM | POA: Diagnosis not present

## 2021-09-06 ENCOUNTER — Other Ambulatory Visit (HOSPITAL_BASED_OUTPATIENT_CLINIC_OR_DEPARTMENT_OTHER): Payer: Self-pay

## 2021-09-10 DIAGNOSIS — M25512 Pain in left shoulder: Secondary | ICD-10-CM | POA: Diagnosis not present

## 2021-09-10 DIAGNOSIS — M542 Cervicalgia: Secondary | ICD-10-CM | POA: Diagnosis not present

## 2021-09-17 DIAGNOSIS — M542 Cervicalgia: Secondary | ICD-10-CM | POA: Diagnosis not present

## 2021-09-17 DIAGNOSIS — M25512 Pain in left shoulder: Secondary | ICD-10-CM | POA: Diagnosis not present

## 2021-09-24 DIAGNOSIS — M542 Cervicalgia: Secondary | ICD-10-CM | POA: Diagnosis not present

## 2021-09-24 DIAGNOSIS — M25512 Pain in left shoulder: Secondary | ICD-10-CM | POA: Diagnosis not present

## 2021-09-26 ENCOUNTER — Other Ambulatory Visit (HOSPITAL_BASED_OUTPATIENT_CLINIC_OR_DEPARTMENT_OTHER): Payer: Self-pay

## 2021-09-26 MED ORDER — METHYLPHENIDATE HCL ER (OSM) 36 MG PO TBCR
EXTENDED_RELEASE_TABLET | ORAL | 0 refills | Status: DC
  Filled 2021-09-26 – 2021-09-30 (×2): qty 30, 30d supply, fill #0

## 2021-09-27 ENCOUNTER — Other Ambulatory Visit (HOSPITAL_BASED_OUTPATIENT_CLINIC_OR_DEPARTMENT_OTHER): Payer: Self-pay

## 2021-09-30 ENCOUNTER — Other Ambulatory Visit (HOSPITAL_BASED_OUTPATIENT_CLINIC_OR_DEPARTMENT_OTHER): Payer: Self-pay

## 2021-10-01 DIAGNOSIS — F411 Generalized anxiety disorder: Secondary | ICD-10-CM | POA: Diagnosis not present

## 2021-10-02 DIAGNOSIS — M542 Cervicalgia: Secondary | ICD-10-CM | POA: Diagnosis not present

## 2021-10-02 DIAGNOSIS — M25512 Pain in left shoulder: Secondary | ICD-10-CM | POA: Diagnosis not present

## 2021-10-08 ENCOUNTER — Other Ambulatory Visit (HOSPITAL_BASED_OUTPATIENT_CLINIC_OR_DEPARTMENT_OTHER): Payer: Self-pay

## 2021-10-08 DIAGNOSIS — M542 Cervicalgia: Secondary | ICD-10-CM | POA: Diagnosis not present

## 2021-10-08 DIAGNOSIS — M25512 Pain in left shoulder: Secondary | ICD-10-CM | POA: Diagnosis not present

## 2021-10-10 ENCOUNTER — Other Ambulatory Visit (HOSPITAL_BASED_OUTPATIENT_CLINIC_OR_DEPARTMENT_OTHER): Payer: Self-pay

## 2021-10-10 DIAGNOSIS — K50818 Crohn's disease of both small and large intestine with other complication: Secondary | ICD-10-CM | POA: Diagnosis not present

## 2021-10-10 DIAGNOSIS — K5090 Crohn's disease, unspecified, without complications: Secondary | ICD-10-CM | POA: Diagnosis not present

## 2021-10-10 MED ORDER — XIFAXAN 550 MG PO TABS
ORAL_TABLET | ORAL | 0 refills | Status: AC
  Filled 2021-10-10: qty 28, 14d supply, fill #0

## 2021-10-11 ENCOUNTER — Other Ambulatory Visit (HOSPITAL_BASED_OUTPATIENT_CLINIC_OR_DEPARTMENT_OTHER): Payer: Self-pay

## 2021-10-14 ENCOUNTER — Other Ambulatory Visit (HOSPITAL_BASED_OUTPATIENT_CLINIC_OR_DEPARTMENT_OTHER): Payer: Self-pay

## 2021-10-15 ENCOUNTER — Other Ambulatory Visit (HOSPITAL_BASED_OUTPATIENT_CLINIC_OR_DEPARTMENT_OTHER): Payer: Self-pay

## 2021-10-16 ENCOUNTER — Other Ambulatory Visit (HOSPITAL_BASED_OUTPATIENT_CLINIC_OR_DEPARTMENT_OTHER): Payer: Self-pay

## 2021-10-17 ENCOUNTER — Other Ambulatory Visit (HOSPITAL_BASED_OUTPATIENT_CLINIC_OR_DEPARTMENT_OTHER): Payer: Self-pay

## 2021-10-18 ENCOUNTER — Other Ambulatory Visit (HOSPITAL_BASED_OUTPATIENT_CLINIC_OR_DEPARTMENT_OTHER): Payer: Self-pay

## 2021-10-18 DIAGNOSIS — F331 Major depressive disorder, recurrent, moderate: Secondary | ICD-10-CM | POA: Diagnosis not present

## 2021-10-18 DIAGNOSIS — F411 Generalized anxiety disorder: Secondary | ICD-10-CM | POA: Diagnosis not present

## 2021-10-18 DIAGNOSIS — F902 Attention-deficit hyperactivity disorder, combined type: Secondary | ICD-10-CM | POA: Diagnosis not present

## 2021-10-18 MED ORDER — METHYLPHENIDATE HCL ER (OSM) 36 MG PO TBCR
EXTENDED_RELEASE_TABLET | ORAL | 0 refills | Status: DC
  Filled 2021-10-18 – 2021-11-01 (×2): qty 30, 30d supply, fill #0

## 2021-10-18 MED ORDER — METHYLPHENIDATE HCL 5 MG PO TABS
ORAL_TABLET | ORAL | 0 refills | Status: DC
  Filled 2021-10-18: qty 30, 30d supply, fill #0

## 2021-10-22 ENCOUNTER — Other Ambulatory Visit (HOSPITAL_BASED_OUTPATIENT_CLINIC_OR_DEPARTMENT_OTHER): Payer: Self-pay

## 2021-10-23 ENCOUNTER — Other Ambulatory Visit (HOSPITAL_BASED_OUTPATIENT_CLINIC_OR_DEPARTMENT_OTHER): Payer: Self-pay

## 2021-10-23 DIAGNOSIS — D225 Melanocytic nevi of trunk: Secondary | ICD-10-CM | POA: Diagnosis not present

## 2021-10-23 DIAGNOSIS — L728 Other follicular cysts of the skin and subcutaneous tissue: Secondary | ICD-10-CM | POA: Diagnosis not present

## 2021-10-23 DIAGNOSIS — F411 Generalized anxiety disorder: Secondary | ICD-10-CM | POA: Diagnosis not present

## 2021-10-23 DIAGNOSIS — L649 Androgenic alopecia, unspecified: Secondary | ICD-10-CM | POA: Diagnosis not present

## 2021-10-24 ENCOUNTER — Other Ambulatory Visit (HOSPITAL_BASED_OUTPATIENT_CLINIC_OR_DEPARTMENT_OTHER): Payer: Self-pay

## 2021-10-24 MED ORDER — FINASTERIDE 1 MG PO TABS
1.0000 mg | ORAL_TABLET | Freq: Every day | ORAL | 3 refills | Status: DC
  Filled 2021-10-24: qty 90, 90d supply, fill #0
  Filled 2022-01-30: qty 90, 90d supply, fill #1
  Filled 2022-06-27: qty 90, 90d supply, fill #2
  Filled 2022-10-09: qty 90, 90d supply, fill #3

## 2021-10-29 DIAGNOSIS — K5010 Crohn's disease of large intestine without complications: Secondary | ICD-10-CM | POA: Diagnosis not present

## 2021-11-01 ENCOUNTER — Other Ambulatory Visit (HOSPITAL_BASED_OUTPATIENT_CLINIC_OR_DEPARTMENT_OTHER): Payer: Self-pay

## 2021-11-04 DIAGNOSIS — M542 Cervicalgia: Secondary | ICD-10-CM | POA: Diagnosis not present

## 2021-11-04 DIAGNOSIS — M25512 Pain in left shoulder: Secondary | ICD-10-CM | POA: Diagnosis not present

## 2021-11-08 ENCOUNTER — Other Ambulatory Visit (HOSPITAL_BASED_OUTPATIENT_CLINIC_OR_DEPARTMENT_OTHER): Payer: Self-pay

## 2021-11-08 DIAGNOSIS — M25512 Pain in left shoulder: Secondary | ICD-10-CM | POA: Diagnosis not present

## 2021-11-08 MED ORDER — MELOXICAM 7.5 MG PO TABS
7.5000 mg | ORAL_TABLET | Freq: Two times a day (BID) | ORAL | 0 refills | Status: AC
  Filled 2021-11-08: qty 60, 30d supply, fill #0

## 2021-11-11 ENCOUNTER — Other Ambulatory Visit (HOSPITAL_BASED_OUTPATIENT_CLINIC_OR_DEPARTMENT_OTHER): Payer: Self-pay

## 2021-11-11 DIAGNOSIS — L750 Bromhidrosis: Secondary | ICD-10-CM | POA: Diagnosis not present

## 2021-11-11 MED ORDER — CLINDAMYCIN PHOSPHATE 1 % EX SOLN
CUTANEOUS | 2 refills | Status: AC
  Filled 2021-11-11: qty 60, 30d supply, fill #0

## 2021-11-14 DIAGNOSIS — F411 Generalized anxiety disorder: Secondary | ICD-10-CM | POA: Diagnosis not present

## 2021-11-15 ENCOUNTER — Other Ambulatory Visit (HOSPITAL_BASED_OUTPATIENT_CLINIC_OR_DEPARTMENT_OTHER): Payer: Self-pay

## 2021-11-15 DIAGNOSIS — F902 Attention-deficit hyperactivity disorder, combined type: Secondary | ICD-10-CM | POA: Diagnosis not present

## 2021-11-15 DIAGNOSIS — F331 Major depressive disorder, recurrent, moderate: Secondary | ICD-10-CM | POA: Diagnosis not present

## 2021-11-15 DIAGNOSIS — F411 Generalized anxiety disorder: Secondary | ICD-10-CM | POA: Diagnosis not present

## 2021-11-15 MED ORDER — METHYLPHENIDATE HCL ER (OSM) 36 MG PO TBCR
36.0000 mg | EXTENDED_RELEASE_TABLET | Freq: Every morning | ORAL | 0 refills | Status: DC
  Filled 2021-11-15 – 2021-11-29 (×2): qty 30, 30d supply, fill #0

## 2021-11-15 MED ORDER — METHYLPHENIDATE HCL 5 MG PO TABS
5.0000 mg | ORAL_TABLET | Freq: Every morning | ORAL | 0 refills | Status: DC
  Filled 2021-11-15 (×2): qty 30, 30d supply, fill #0

## 2021-11-29 ENCOUNTER — Other Ambulatory Visit (HOSPITAL_BASED_OUTPATIENT_CLINIC_OR_DEPARTMENT_OTHER): Payer: Self-pay

## 2021-12-05 DIAGNOSIS — F411 Generalized anxiety disorder: Secondary | ICD-10-CM | POA: Diagnosis not present

## 2021-12-13 ENCOUNTER — Other Ambulatory Visit (HOSPITAL_BASED_OUTPATIENT_CLINIC_OR_DEPARTMENT_OTHER): Payer: Self-pay

## 2021-12-13 DIAGNOSIS — F331 Major depressive disorder, recurrent, moderate: Secondary | ICD-10-CM | POA: Diagnosis not present

## 2021-12-13 DIAGNOSIS — F411 Generalized anxiety disorder: Secondary | ICD-10-CM | POA: Diagnosis not present

## 2021-12-13 DIAGNOSIS — F902 Attention-deficit hyperactivity disorder, combined type: Secondary | ICD-10-CM | POA: Diagnosis not present

## 2021-12-13 MED ORDER — METHYLPHENIDATE HCL ER (OSM) 36 MG PO TBCR
EXTENDED_RELEASE_TABLET | ORAL | 0 refills | Status: AC
  Filled 2022-02-28: qty 30, 30d supply, fill #0
  Filled ????-??-??: fill #0

## 2021-12-13 MED ORDER — METHYLPHENIDATE HCL ER (OSM) 36 MG PO TBCR
EXTENDED_RELEASE_TABLET | ORAL | 0 refills | Status: AC
  Filled 2021-12-13 – 2022-01-01 (×2): qty 30, 30d supply, fill #0

## 2021-12-13 MED ORDER — METHYLPHENIDATE HCL ER (OSM) 36 MG PO TBCR
36.0000 mg | EXTENDED_RELEASE_TABLET | Freq: Every morning | ORAL | 0 refills | Status: AC
  Filled 2022-01-30: qty 30, 30d supply, fill #0

## 2021-12-13 MED ORDER — METHYLPHENIDATE HCL 5 MG PO TABS
ORAL_TABLET | ORAL | 0 refills | Status: AC
  Filled 2021-12-13: qty 30, 30d supply, fill #0

## 2021-12-13 MED ORDER — METHYLPHENIDATE HCL 5 MG PO TABS
5.0000 mg | ORAL_TABLET | Freq: Every day | ORAL | 0 refills | Status: AC
  Filled 2022-01-17: qty 30, 30d supply, fill #0

## 2021-12-13 MED ORDER — METHYLPHENIDATE HCL 5 MG PO TABS
ORAL_TABLET | ORAL | 0 refills | Status: AC
  Filled 2022-02-20: qty 30, 30d supply, fill #0

## 2021-12-26 DIAGNOSIS — K5010 Crohn's disease of large intestine without complications: Secondary | ICD-10-CM | POA: Diagnosis not present

## 2021-12-27 DIAGNOSIS — F411 Generalized anxiety disorder: Secondary | ICD-10-CM | POA: Diagnosis not present

## 2022-01-01 ENCOUNTER — Other Ambulatory Visit (HOSPITAL_BASED_OUTPATIENT_CLINIC_OR_DEPARTMENT_OTHER): Payer: Self-pay

## 2022-01-02 DIAGNOSIS — K50818 Crohn's disease of both small and large intestine with other complication: Secondary | ICD-10-CM | POA: Diagnosis not present

## 2022-01-06 DIAGNOSIS — K2970 Gastritis, unspecified, without bleeding: Secondary | ICD-10-CM | POA: Diagnosis not present

## 2022-01-06 DIAGNOSIS — K50818 Crohn's disease of both small and large intestine with other complication: Secondary | ICD-10-CM | POA: Diagnosis not present

## 2022-01-17 ENCOUNTER — Other Ambulatory Visit (HOSPITAL_BASED_OUTPATIENT_CLINIC_OR_DEPARTMENT_OTHER): Payer: Self-pay

## 2022-01-21 DIAGNOSIS — Z760 Encounter for issue of repeat prescription: Secondary | ICD-10-CM | POA: Diagnosis not present

## 2022-01-24 DIAGNOSIS — F902 Attention-deficit hyperactivity disorder, combined type: Secondary | ICD-10-CM | POA: Diagnosis not present

## 2022-01-30 ENCOUNTER — Other Ambulatory Visit (HOSPITAL_BASED_OUTPATIENT_CLINIC_OR_DEPARTMENT_OTHER): Payer: Self-pay

## 2022-02-17 DIAGNOSIS — F411 Generalized anxiety disorder: Secondary | ICD-10-CM | POA: Diagnosis not present

## 2022-02-20 ENCOUNTER — Other Ambulatory Visit (HOSPITAL_BASED_OUTPATIENT_CLINIC_OR_DEPARTMENT_OTHER): Payer: Self-pay

## 2022-02-20 DIAGNOSIS — K5010 Crohn's disease of large intestine without complications: Secondary | ICD-10-CM | POA: Diagnosis not present

## 2022-02-28 ENCOUNTER — Other Ambulatory Visit (HOSPITAL_BASED_OUTPATIENT_CLINIC_OR_DEPARTMENT_OTHER): Payer: Self-pay

## 2022-03-05 ENCOUNTER — Other Ambulatory Visit (HOSPITAL_BASED_OUTPATIENT_CLINIC_OR_DEPARTMENT_OTHER): Payer: Self-pay

## 2022-03-07 DIAGNOSIS — F331 Major depressive disorder, recurrent, moderate: Secondary | ICD-10-CM | POA: Diagnosis not present

## 2022-03-07 DIAGNOSIS — F411 Generalized anxiety disorder: Secondary | ICD-10-CM | POA: Diagnosis not present

## 2022-03-07 DIAGNOSIS — F902 Attention-deficit hyperactivity disorder, combined type: Secondary | ICD-10-CM | POA: Diagnosis not present

## 2022-03-21 DIAGNOSIS — K50818 Crohn's disease of both small and large intestine with other complication: Secondary | ICD-10-CM | POA: Diagnosis not present

## 2022-03-26 ENCOUNTER — Other Ambulatory Visit (HOSPITAL_BASED_OUTPATIENT_CLINIC_OR_DEPARTMENT_OTHER): Payer: Self-pay

## 2022-03-26 MED ORDER — METHYLPHENIDATE HCL 5 MG PO TABS
5.0000 mg | ORAL_TABLET | Freq: Every day | ORAL | 0 refills | Status: AC
  Filled 2022-03-26: qty 30, 30d supply, fill #0

## 2022-03-26 MED ORDER — METHYLPHENIDATE HCL 5 MG PO TABS
ORAL_TABLET | ORAL | 0 refills | Status: DC
  Filled 2022-04-24: qty 30, 30d supply, fill #0

## 2022-03-28 ENCOUNTER — Other Ambulatory Visit (HOSPITAL_BASED_OUTPATIENT_CLINIC_OR_DEPARTMENT_OTHER): Payer: Self-pay

## 2022-04-04 ENCOUNTER — Other Ambulatory Visit (HOSPITAL_BASED_OUTPATIENT_CLINIC_OR_DEPARTMENT_OTHER): Payer: Self-pay

## 2022-04-04 DIAGNOSIS — K50811 Crohn's disease of both small and large intestine with rectal bleeding: Secondary | ICD-10-CM | POA: Diagnosis not present

## 2022-04-04 MED ORDER — BUDESONIDE 3 MG PO CPEP
ORAL_CAPSULE | ORAL | 1 refills | Status: AC
  Filled 2022-04-04: qty 90, 30d supply, fill #0
  Filled 2022-04-30: qty 90, 30d supply, fill #1

## 2022-04-07 ENCOUNTER — Other Ambulatory Visit (HOSPITAL_BASED_OUTPATIENT_CLINIC_OR_DEPARTMENT_OTHER): Payer: Self-pay

## 2022-04-07 MED ORDER — METHYLPHENIDATE HCL ER (OSM) 36 MG PO TBCR
EXTENDED_RELEASE_TABLET | ORAL | 0 refills | Status: DC
  Filled 2022-04-07: qty 30, 30d supply, fill #0

## 2022-04-09 ENCOUNTER — Other Ambulatory Visit (HOSPITAL_BASED_OUTPATIENT_CLINIC_OR_DEPARTMENT_OTHER): Payer: Self-pay

## 2022-04-09 MED ORDER — VILAZODONE HCL 40 MG PO TABS
ORAL_TABLET | Freq: Every day | ORAL | 2 refills | Status: DC
  Filled 2022-04-09: qty 90, 90d supply, fill #0
  Filled 2022-07-09: qty 90, 90d supply, fill #1
  Filled 2022-10-09: qty 90, 90d supply, fill #2

## 2022-04-17 DIAGNOSIS — K50818 Crohn's disease of both small and large intestine with other complication: Secondary | ICD-10-CM | POA: Diagnosis not present

## 2022-04-17 DIAGNOSIS — K5010 Crohn's disease of large intestine without complications: Secondary | ICD-10-CM | POA: Diagnosis not present

## 2022-04-21 ENCOUNTER — Other Ambulatory Visit (HOSPITAL_BASED_OUTPATIENT_CLINIC_OR_DEPARTMENT_OTHER): Payer: Self-pay

## 2022-04-24 ENCOUNTER — Other Ambulatory Visit (HOSPITAL_BASED_OUTPATIENT_CLINIC_OR_DEPARTMENT_OTHER): Payer: Self-pay

## 2022-04-25 ENCOUNTER — Other Ambulatory Visit (HOSPITAL_BASED_OUTPATIENT_CLINIC_OR_DEPARTMENT_OTHER): Payer: Self-pay

## 2022-04-25 DIAGNOSIS — R5382 Chronic fatigue, unspecified: Secondary | ICD-10-CM | POA: Diagnosis not present

## 2022-04-25 DIAGNOSIS — F331 Major depressive disorder, recurrent, moderate: Secondary | ICD-10-CM | POA: Diagnosis not present

## 2022-04-25 DIAGNOSIS — F411 Generalized anxiety disorder: Secondary | ICD-10-CM | POA: Diagnosis not present

## 2022-04-25 DIAGNOSIS — F902 Attention-deficit hyperactivity disorder, combined type: Secondary | ICD-10-CM | POA: Diagnosis not present

## 2022-04-25 MED ORDER — METHYLPHENIDATE HCL ER (OSM) 36 MG PO TBCR
EXTENDED_RELEASE_TABLET | ORAL | 0 refills | Status: AC
  Filled 2022-05-05: qty 30, 30d supply, fill #0

## 2022-04-25 MED ORDER — METHYLPHENIDATE HCL 5 MG PO TABS
5.0000 mg | ORAL_TABLET | Freq: Every day | ORAL | 0 refills | Status: DC
  Filled 2022-05-26: qty 30, 30d supply, fill #0

## 2022-04-28 ENCOUNTER — Other Ambulatory Visit (HOSPITAL_BASED_OUTPATIENT_CLINIC_OR_DEPARTMENT_OTHER): Payer: Self-pay

## 2022-04-30 ENCOUNTER — Other Ambulatory Visit (HOSPITAL_BASED_OUTPATIENT_CLINIC_OR_DEPARTMENT_OTHER): Payer: Self-pay

## 2022-05-05 ENCOUNTER — Other Ambulatory Visit (HOSPITAL_BASED_OUTPATIENT_CLINIC_OR_DEPARTMENT_OTHER): Payer: Self-pay

## 2022-05-16 ENCOUNTER — Other Ambulatory Visit (HOSPITAL_BASED_OUTPATIENT_CLINIC_OR_DEPARTMENT_OTHER): Payer: Self-pay

## 2022-05-16 DIAGNOSIS — F411 Generalized anxiety disorder: Secondary | ICD-10-CM | POA: Diagnosis not present

## 2022-05-16 DIAGNOSIS — F331 Major depressive disorder, recurrent, moderate: Secondary | ICD-10-CM | POA: Diagnosis not present

## 2022-05-16 DIAGNOSIS — F902 Attention-deficit hyperactivity disorder, combined type: Secondary | ICD-10-CM | POA: Diagnosis not present

## 2022-05-16 MED ORDER — QELBREE 200 MG PO CP24
ORAL_CAPSULE | Freq: Two times a day (BID) | ORAL | 2 refills | Status: AC
  Filled 2022-05-16: qty 60, 30d supply, fill #0

## 2022-05-19 ENCOUNTER — Other Ambulatory Visit (HOSPITAL_BASED_OUTPATIENT_CLINIC_OR_DEPARTMENT_OTHER): Payer: Self-pay

## 2022-05-26 ENCOUNTER — Other Ambulatory Visit (HOSPITAL_BASED_OUTPATIENT_CLINIC_OR_DEPARTMENT_OTHER): Payer: Self-pay

## 2022-06-02 ENCOUNTER — Other Ambulatory Visit (HOSPITAL_BASED_OUTPATIENT_CLINIC_OR_DEPARTMENT_OTHER): Payer: Self-pay

## 2022-06-02 MED ORDER — METHYLPHENIDATE HCL ER (OSM) 36 MG PO TBCR
36.0000 mg | EXTENDED_RELEASE_TABLET | Freq: Every morning | ORAL | 0 refills | Status: AC
  Filled 2022-07-31: qty 30, 30d supply, fill #0

## 2022-06-02 MED ORDER — METHYLPHENIDATE HCL ER (OSM) 36 MG PO TBCR
36.0000 mg | EXTENDED_RELEASE_TABLET | Freq: Every morning | ORAL | 0 refills | Status: AC
  Filled 2022-06-02: qty 30, 30d supply, fill #0

## 2022-06-03 ENCOUNTER — Ambulatory Visit: Payer: 59 | Attending: Internal Medicine

## 2022-06-03 ENCOUNTER — Other Ambulatory Visit (HOSPITAL_BASED_OUTPATIENT_CLINIC_OR_DEPARTMENT_OTHER): Payer: Self-pay

## 2022-06-03 DIAGNOSIS — Z23 Encounter for immunization: Secondary | ICD-10-CM

## 2022-06-03 MED ORDER — PFIZER COVID-19 VAC BIVALENT 30 MCG/0.3ML IM SUSP
INTRAMUSCULAR | 0 refills | Status: AC
  Filled 2022-06-03: qty 0.3, 1d supply, fill #0

## 2022-06-03 NOTE — Progress Notes (Signed)
   Covid-19 Vaccination Clinic  Name:  [name]    MRN: [id number] DOB: 07/04/1996  06/03/2022  [name] was observed post Covid-19 immunization for 15 minutes without incident. He was provided with Vaccine Information Sheet and instruction to access the V-Safe system.   [name] was instructed to call 911 with any severe reactions post vaccine: Difficulty breathing  Swelling of face and throat  A fast heartbeat  A bad rash all over body  Dizziness and weakness   Immunizations Administered     Name Date Dose VIS Date Route   Pfizer Covid-19 Vaccine Bivalent Booster 06/03/2022  2:44 PM 0.3 mL 06/26/2021 Intramuscular   Manufacturer: [place]   Lot: [id number]   [place]: [phone]

## 2022-06-04 ENCOUNTER — Other Ambulatory Visit (HOSPITAL_BASED_OUTPATIENT_CLINIC_OR_DEPARTMENT_OTHER): Payer: Self-pay

## 2022-06-05 ENCOUNTER — Other Ambulatory Visit (HOSPITAL_BASED_OUTPATIENT_CLINIC_OR_DEPARTMENT_OTHER): Payer: Self-pay

## 2022-06-10 ENCOUNTER — Other Ambulatory Visit (HOSPITAL_BASED_OUTPATIENT_CLINIC_OR_DEPARTMENT_OTHER): Payer: Self-pay

## 2022-06-11 ENCOUNTER — Other Ambulatory Visit (HOSPITAL_BASED_OUTPATIENT_CLINIC_OR_DEPARTMENT_OTHER): Payer: Self-pay

## 2022-06-13 ENCOUNTER — Other Ambulatory Visit (HOSPITAL_BASED_OUTPATIENT_CLINIC_OR_DEPARTMENT_OTHER): Payer: Self-pay

## 2022-06-13 DIAGNOSIS — R5382 Chronic fatigue, unspecified: Secondary | ICD-10-CM | POA: Diagnosis not present

## 2022-06-13 DIAGNOSIS — F902 Attention-deficit hyperactivity disorder, combined type: Secondary | ICD-10-CM | POA: Diagnosis not present

## 2022-06-13 DIAGNOSIS — F331 Major depressive disorder, recurrent, moderate: Secondary | ICD-10-CM | POA: Diagnosis not present

## 2022-06-13 DIAGNOSIS — F411 Generalized anxiety disorder: Secondary | ICD-10-CM | POA: Diagnosis not present

## 2022-06-13 MED ORDER — METHYLPHENIDATE HCL ER (OSM) 36 MG PO TBCR: 36.0000 mg | EXTENDED_RELEASE_TABLET | Freq: Every morning | ORAL | 0 refills | Status: DC

## 2022-06-19 DIAGNOSIS — K5010 Crohn's disease of large intestine without complications: Secondary | ICD-10-CM | POA: Diagnosis not present

## 2022-06-24 ENCOUNTER — Other Ambulatory Visit (HOSPITAL_BASED_OUTPATIENT_CLINIC_OR_DEPARTMENT_OTHER): Payer: Self-pay

## 2022-06-27 ENCOUNTER — Other Ambulatory Visit (HOSPITAL_BASED_OUTPATIENT_CLINIC_OR_DEPARTMENT_OTHER): Payer: Self-pay

## 2022-06-27 MED ORDER — METHYLPHENIDATE HCL ER (OSM) 36 MG PO TBCR
36.0000 mg | EXTENDED_RELEASE_TABLET | Freq: Every morning | ORAL | 0 refills | Status: AC
  Filled 2022-06-27: qty 30, 30d supply, fill #0

## 2022-06-27 MED ORDER — METHYLPHENIDATE HCL 5 MG PO TABS
5.0000 mg | ORAL_TABLET | Freq: Every day | ORAL | 0 refills | Status: DC
  Filled 2022-06-27: qty 30, 30d supply, fill #0

## 2022-07-09 ENCOUNTER — Other Ambulatory Visit (HOSPITAL_BASED_OUTPATIENT_CLINIC_OR_DEPARTMENT_OTHER): Payer: Self-pay

## 2022-07-11 ENCOUNTER — Other Ambulatory Visit (HOSPITAL_BASED_OUTPATIENT_CLINIC_OR_DEPARTMENT_OTHER): Payer: Self-pay

## 2022-07-11 DIAGNOSIS — F411 Generalized anxiety disorder: Secondary | ICD-10-CM | POA: Diagnosis not present

## 2022-07-11 DIAGNOSIS — F902 Attention-deficit hyperactivity disorder, combined type: Secondary | ICD-10-CM | POA: Diagnosis not present

## 2022-07-11 DIAGNOSIS — F331 Major depressive disorder, recurrent, moderate: Secondary | ICD-10-CM | POA: Diagnosis not present

## 2022-07-11 MED ORDER — METHYLPHENIDATE HCL ER (OSM) 36 MG PO TBCR: 36.0000 mg | EXTENDED_RELEASE_TABLET | Freq: Every morning | ORAL | 0 refills | Status: DC

## 2022-07-11 MED ORDER — QELBREE 200 MG PO CP24
200.0000 mg | ORAL_CAPSULE | Freq: Two times a day (BID) | ORAL | 2 refills | Status: AC
  Filled 2022-07-11: qty 60, 30d supply, fill #0

## 2022-07-11 MED ORDER — METHYLPHENIDATE HCL 5 MG PO TABS
5.0000 mg | ORAL_TABLET | Freq: Every day | ORAL | 0 refills | Status: DC
  Filled 2022-07-31: qty 30, 30d supply, fill #0

## 2022-07-18 ENCOUNTER — Other Ambulatory Visit (HOSPITAL_BASED_OUTPATIENT_CLINIC_OR_DEPARTMENT_OTHER): Payer: Self-pay

## 2022-07-18 DIAGNOSIS — K50811 Crohn's disease of both small and large intestine with rectal bleeding: Secondary | ICD-10-CM | POA: Diagnosis not present

## 2022-07-18 MED ORDER — HYDROCORTISONE ACETATE 25 MG RE SUPP
RECTAL | 0 refills | Status: AC
  Filled 2022-07-18 (×2): qty 28, 14d supply, fill #0

## 2022-07-21 ENCOUNTER — Other Ambulatory Visit (HOSPITAL_BASED_OUTPATIENT_CLINIC_OR_DEPARTMENT_OTHER): Payer: Self-pay

## 2022-07-29 DIAGNOSIS — Z23 Encounter for immunization: Secondary | ICD-10-CM | POA: Diagnosis not present

## 2022-07-29 DIAGNOSIS — K5010 Crohn's disease of large intestine without complications: Secondary | ICD-10-CM | POA: Diagnosis not present

## 2022-07-31 ENCOUNTER — Other Ambulatory Visit (HOSPITAL_BASED_OUTPATIENT_CLINIC_OR_DEPARTMENT_OTHER): Payer: Self-pay

## 2022-08-08 ENCOUNTER — Other Ambulatory Visit (HOSPITAL_BASED_OUTPATIENT_CLINIC_OR_DEPARTMENT_OTHER): Payer: Self-pay

## 2022-08-08 DIAGNOSIS — F411 Generalized anxiety disorder: Secondary | ICD-10-CM | POA: Diagnosis not present

## 2022-08-08 DIAGNOSIS — F902 Attention-deficit hyperactivity disorder, combined type: Secondary | ICD-10-CM | POA: Diagnosis not present

## 2022-08-08 DIAGNOSIS — F331 Major depressive disorder, recurrent, moderate: Secondary | ICD-10-CM | POA: Diagnosis not present

## 2022-08-08 MED ORDER — METHYLPHENIDATE HCL ER (OSM) 54 MG PO TBCR
54.0000 mg | EXTENDED_RELEASE_TABLET | Freq: Every day | ORAL | 0 refills | Status: DC
  Filled 2022-08-08: qty 30, 30d supply, fill #0

## 2022-08-17 DIAGNOSIS — Z1152 Encounter for screening for COVID-19: Secondary | ICD-10-CM | POA: Diagnosis not present

## 2022-08-17 DIAGNOSIS — Z880 Allergy status to penicillin: Secondary | ICD-10-CM | POA: Diagnosis not present

## 2022-08-17 DIAGNOSIS — R509 Fever, unspecified: Secondary | ICD-10-CM | POA: Diagnosis not present

## 2022-08-17 DIAGNOSIS — J069 Acute upper respiratory infection, unspecified: Secondary | ICD-10-CM | POA: Diagnosis not present

## 2022-08-17 DIAGNOSIS — J029 Acute pharyngitis, unspecified: Secondary | ICD-10-CM | POA: Diagnosis not present

## 2022-08-17 DIAGNOSIS — R5383 Other fatigue: Secondary | ICD-10-CM | POA: Diagnosis not present

## 2022-08-18 DIAGNOSIS — J029 Acute pharyngitis, unspecified: Secondary | ICD-10-CM | POA: Diagnosis not present

## 2022-08-26 DIAGNOSIS — K5010 Crohn's disease of large intestine without complications: Secondary | ICD-10-CM | POA: Diagnosis not present

## 2022-09-01 ENCOUNTER — Other Ambulatory Visit (HOSPITAL_BASED_OUTPATIENT_CLINIC_OR_DEPARTMENT_OTHER): Payer: Self-pay

## 2022-09-02 ENCOUNTER — Other Ambulatory Visit (HOSPITAL_BASED_OUTPATIENT_CLINIC_OR_DEPARTMENT_OTHER): Payer: Self-pay

## 2022-09-02 MED ORDER — METHYLPHENIDATE HCL 5 MG PO TABS
5.0000 mg | ORAL_TABLET | Freq: Every day | ORAL | 0 refills | Status: DC
  Filled 2022-09-02: qty 30, 30d supply, fill #0

## 2022-09-05 ENCOUNTER — Other Ambulatory Visit (HOSPITAL_BASED_OUTPATIENT_CLINIC_OR_DEPARTMENT_OTHER): Payer: Self-pay

## 2022-09-05 DIAGNOSIS — F331 Major depressive disorder, recurrent, moderate: Secondary | ICD-10-CM | POA: Diagnosis not present

## 2022-09-05 DIAGNOSIS — F902 Attention-deficit hyperactivity disorder, combined type: Secondary | ICD-10-CM | POA: Diagnosis not present

## 2022-09-05 DIAGNOSIS — F4310 Post-traumatic stress disorder, unspecified: Secondary | ICD-10-CM | POA: Diagnosis not present

## 2022-09-05 DIAGNOSIS — F411 Generalized anxiety disorder: Secondary | ICD-10-CM | POA: Diagnosis not present

## 2022-09-05 MED ORDER — METHYLPHENIDATE HCL ER (OSM) 54 MG PO TBCR
54.0000 mg | EXTENDED_RELEASE_TABLET | Freq: Every day | ORAL | 0 refills | Status: AC
  Filled 2022-09-24: qty 30, 30d supply, fill #0

## 2022-09-05 MED ORDER — QELBREE 200 MG PO CP24
200.0000 mg | ORAL_CAPSULE | Freq: Two times a day (BID) | ORAL | 2 refills | Status: AC
  Filled 2022-09-05: qty 60, 30d supply, fill #0
  Filled 2022-10-09: qty 60, 30d supply, fill #1

## 2022-09-15 ENCOUNTER — Other Ambulatory Visit (HOSPITAL_BASED_OUTPATIENT_CLINIC_OR_DEPARTMENT_OTHER): Payer: Self-pay

## 2022-09-23 DIAGNOSIS — K5010 Crohn's disease of large intestine without complications: Secondary | ICD-10-CM | POA: Diagnosis not present

## 2022-09-24 ENCOUNTER — Other Ambulatory Visit (HOSPITAL_BASED_OUTPATIENT_CLINIC_OR_DEPARTMENT_OTHER): Payer: Self-pay

## 2022-09-29 ENCOUNTER — Other Ambulatory Visit (HOSPITAL_BASED_OUTPATIENT_CLINIC_OR_DEPARTMENT_OTHER): Payer: Self-pay

## 2022-09-29 MED ORDER — METHYLPHENIDATE HCL 5 MG PO TABS
5.0000 mg | ORAL_TABLET | Freq: Every day | ORAL | 0 refills | Status: DC
  Filled 2022-09-29 – 2022-09-30 (×2): qty 30, 30d supply, fill #0

## 2022-09-29 MED ORDER — METHYLPHENIDATE HCL ER (OSM) 54 MG PO TBCR
54.0000 mg | EXTENDED_RELEASE_TABLET | Freq: Every day | ORAL | 0 refills | Status: AC
  Filled 2022-09-29 – 2022-10-23 (×2): qty 30, 30d supply, fill #0

## 2022-09-30 ENCOUNTER — Other Ambulatory Visit (HOSPITAL_BASED_OUTPATIENT_CLINIC_OR_DEPARTMENT_OTHER): Payer: Self-pay

## 2022-10-03 ENCOUNTER — Other Ambulatory Visit (HOSPITAL_BASED_OUTPATIENT_CLINIC_OR_DEPARTMENT_OTHER): Payer: Self-pay

## 2022-10-03 DIAGNOSIS — F902 Attention-deficit hyperactivity disorder, combined type: Secondary | ICD-10-CM | POA: Diagnosis not present

## 2022-10-03 DIAGNOSIS — F4310 Post-traumatic stress disorder, unspecified: Secondary | ICD-10-CM | POA: Diagnosis not present

## 2022-10-03 DIAGNOSIS — F331 Major depressive disorder, recurrent, moderate: Secondary | ICD-10-CM | POA: Diagnosis not present

## 2022-10-03 DIAGNOSIS — F411 Generalized anxiety disorder: Secondary | ICD-10-CM | POA: Diagnosis not present

## 2022-10-03 MED ORDER — METHYLPHENIDATE HCL ER (OSM) 54 MG PO TBCR
54.0000 mg | EXTENDED_RELEASE_TABLET | Freq: Every day | ORAL | 0 refills | Status: DC
  Filled 2022-11-24: qty 30, 30d supply, fill #0

## 2022-10-03 MED ORDER — METHYLPHENIDATE HCL 5 MG PO TABS
5.0000 mg | ORAL_TABLET | Freq: Every day | ORAL | 0 refills | Status: DC
  Filled 2022-10-31: qty 30, 30d supply, fill #0

## 2022-10-09 ENCOUNTER — Other Ambulatory Visit (HOSPITAL_BASED_OUTPATIENT_CLINIC_OR_DEPARTMENT_OTHER): Payer: Self-pay

## 2022-10-21 DIAGNOSIS — F331 Major depressive disorder, recurrent, moderate: Secondary | ICD-10-CM | POA: Diagnosis not present

## 2022-10-21 DIAGNOSIS — K5010 Crohn's disease of large intestine without complications: Secondary | ICD-10-CM | POA: Diagnosis not present

## 2022-10-21 DIAGNOSIS — F4310 Post-traumatic stress disorder, unspecified: Secondary | ICD-10-CM | POA: Diagnosis not present

## 2022-10-21 DIAGNOSIS — F411 Generalized anxiety disorder: Secondary | ICD-10-CM | POA: Diagnosis not present

## 2022-10-23 ENCOUNTER — Other Ambulatory Visit (HOSPITAL_BASED_OUTPATIENT_CLINIC_OR_DEPARTMENT_OTHER): Payer: Self-pay

## 2022-10-23 DIAGNOSIS — Z83518 Family history of other specified eye disorder: Secondary | ICD-10-CM | POA: Diagnosis not present

## 2022-10-23 DIAGNOSIS — H52223 Regular astigmatism, bilateral: Secondary | ICD-10-CM | POA: Diagnosis not present

## 2022-10-29 DIAGNOSIS — D2239 Melanocytic nevi of other parts of face: Secondary | ICD-10-CM | POA: Diagnosis not present

## 2022-10-29 DIAGNOSIS — L700 Acne vulgaris: Secondary | ICD-10-CM | POA: Diagnosis not present

## 2022-10-29 DIAGNOSIS — D225 Melanocytic nevi of trunk: Secondary | ICD-10-CM | POA: Diagnosis not present

## 2022-10-29 DIAGNOSIS — L814 Other melanin hyperpigmentation: Secondary | ICD-10-CM | POA: Diagnosis not present

## 2022-10-30 ENCOUNTER — Other Ambulatory Visit (HOSPITAL_BASED_OUTPATIENT_CLINIC_OR_DEPARTMENT_OTHER): Payer: Self-pay

## 2022-10-31 ENCOUNTER — Other Ambulatory Visit (HOSPITAL_BASED_OUTPATIENT_CLINIC_OR_DEPARTMENT_OTHER): Payer: Self-pay

## 2022-10-31 MED ORDER — CLINDAMYCIN PHOS-BENZOYL PEROX 1.2-5 % EX GEL
1.0000 | Freq: Every day | CUTANEOUS | 5 refills | Status: AC
  Filled 2022-10-31: qty 45, 30d supply, fill #0

## 2022-11-19 DIAGNOSIS — K5010 Crohn's disease of large intestine without complications: Secondary | ICD-10-CM | POA: Diagnosis not present

## 2022-11-21 DIAGNOSIS — K5080 Crohn's disease of both small and large intestine without complications: Secondary | ICD-10-CM | POA: Diagnosis not present

## 2022-11-24 ENCOUNTER — Other Ambulatory Visit (HOSPITAL_BASED_OUTPATIENT_CLINIC_OR_DEPARTMENT_OTHER): Payer: Self-pay

## 2022-11-27 ENCOUNTER — Other Ambulatory Visit (HOSPITAL_BASED_OUTPATIENT_CLINIC_OR_DEPARTMENT_OTHER): Payer: Self-pay

## 2022-11-28 ENCOUNTER — Other Ambulatory Visit (HOSPITAL_BASED_OUTPATIENT_CLINIC_OR_DEPARTMENT_OTHER): Payer: Self-pay

## 2022-12-01 ENCOUNTER — Other Ambulatory Visit (HOSPITAL_BASED_OUTPATIENT_CLINIC_OR_DEPARTMENT_OTHER): Payer: Self-pay

## 2022-12-01 DIAGNOSIS — F902 Attention-deficit hyperactivity disorder, combined type: Secondary | ICD-10-CM | POA: Diagnosis not present

## 2022-12-01 DIAGNOSIS — F4310 Post-traumatic stress disorder, unspecified: Secondary | ICD-10-CM | POA: Diagnosis not present

## 2022-12-01 DIAGNOSIS — F331 Major depressive disorder, recurrent, moderate: Secondary | ICD-10-CM | POA: Diagnosis not present

## 2022-12-01 DIAGNOSIS — F411 Generalized anxiety disorder: Secondary | ICD-10-CM | POA: Diagnosis not present

## 2022-12-04 ENCOUNTER — Other Ambulatory Visit (HOSPITAL_BASED_OUTPATIENT_CLINIC_OR_DEPARTMENT_OTHER): Payer: Self-pay

## 2022-12-04 MED ORDER — METHYLPHENIDATE HCL ER (OSM) 54 MG PO TBCR
54.0000 mg | EXTENDED_RELEASE_TABLET | Freq: Every day | ORAL | 0 refills | Status: DC
  Filled 2022-12-04 – 2023-01-01 (×2): qty 30, 30d supply, fill #0

## 2022-12-04 MED ORDER — METHYLPHENIDATE HCL 5 MG PO TABS
5.0000 mg | ORAL_TABLET | Freq: Every day | ORAL | 0 refills | Status: DC
  Filled 2022-12-04: qty 10, 10d supply, fill #0
  Filled 2022-12-04: qty 20, 20d supply, fill #0

## 2022-12-18 DIAGNOSIS — K5010 Crohn's disease of large intestine without complications: Secondary | ICD-10-CM | POA: Diagnosis not present

## 2023-01-01 ENCOUNTER — Other Ambulatory Visit (HOSPITAL_BASED_OUTPATIENT_CLINIC_OR_DEPARTMENT_OTHER): Payer: Self-pay

## 2023-01-01 MED ORDER — FINASTERIDE 1 MG PO TABS
1.0000 mg | ORAL_TABLET | Freq: Every day | ORAL | 1 refills | Status: AC
  Filled 2023-01-01: qty 90, 90d supply, fill #0
  Filled 2023-04-02: qty 90, 90d supply, fill #1

## 2023-01-02 ENCOUNTER — Other Ambulatory Visit (HOSPITAL_BASED_OUTPATIENT_CLINIC_OR_DEPARTMENT_OTHER): Payer: Self-pay

## 2023-01-02 DIAGNOSIS — L649 Androgenic alopecia, unspecified: Secondary | ICD-10-CM | POA: Diagnosis not present

## 2023-01-02 MED ORDER — FINASTERIDE 1 MG PO TABS
1.0000 mg | ORAL_TABLET | Freq: Every day | ORAL | 3 refills | Status: AC
  Filled 2023-06-08 – 2023-07-17 (×2): qty 90, 90d supply, fill #0
  Filled 2023-10-19: qty 90, 90d supply, fill #1

## 2023-01-02 MED ORDER — VILAZODONE HCL 40 MG PO TABS
40.0000 mg | ORAL_TABLET | Freq: Every day | ORAL | 2 refills | Status: DC
  Filled 2023-01-02: qty 90, 90d supply, fill #0
  Filled 2023-04-02: qty 90, 90d supply, fill #1
  Filled 2023-06-29: qty 90, 90d supply, fill #2

## 2023-01-02 MED ORDER — METHYLPHENIDATE HCL ER (OSM) 54 MG PO TBCR
54.0000 mg | EXTENDED_RELEASE_TABLET | Freq: Every day | ORAL | 0 refills | Status: AC
  Filled 2023-01-02 – 2023-02-02 (×2): qty 30, 30d supply, fill #0

## 2023-01-02 MED ORDER — METHYLPHENIDATE HCL 5 MG PO TABS
5.0000 mg | ORAL_TABLET | Freq: Every day | ORAL | 0 refills | Status: DC
  Filled 2023-01-02: qty 30, 30d supply, fill #0

## 2023-01-05 ENCOUNTER — Other Ambulatory Visit (HOSPITAL_BASED_OUTPATIENT_CLINIC_OR_DEPARTMENT_OTHER): Payer: Self-pay

## 2023-01-08 ENCOUNTER — Other Ambulatory Visit (HOSPITAL_BASED_OUTPATIENT_CLINIC_OR_DEPARTMENT_OTHER): Payer: Self-pay

## 2023-01-08 DIAGNOSIS — F902 Attention-deficit hyperactivity disorder, combined type: Secondary | ICD-10-CM | POA: Diagnosis not present

## 2023-01-08 DIAGNOSIS — F4310 Post-traumatic stress disorder, unspecified: Secondary | ICD-10-CM | POA: Diagnosis not present

## 2023-01-08 DIAGNOSIS — F331 Major depressive disorder, recurrent, moderate: Secondary | ICD-10-CM | POA: Diagnosis not present

## 2023-01-08 DIAGNOSIS — F411 Generalized anxiety disorder: Secondary | ICD-10-CM | POA: Diagnosis not present

## 2023-01-08 MED ORDER — METHYLPHENIDATE HCL ER (OSM) 54 MG PO TBCR
54.0000 mg | EXTENDED_RELEASE_TABLET | Freq: Every day | ORAL | 0 refills | Status: AC
  Filled 2023-03-03: qty 30, 30d supply, fill #0

## 2023-01-08 MED ORDER — METHYLPHENIDATE HCL 5 MG PO TABS
5.0000 mg | ORAL_TABLET | Freq: Every day | ORAL | 0 refills | Status: DC
  Filled 2023-03-03 – 2023-03-05 (×2): qty 30, 30d supply, fill #0

## 2023-01-08 MED ORDER — METHYLPHENIDATE HCL ER (OSM) 54 MG PO TBCR
54.0000 mg | EXTENDED_RELEASE_TABLET | Freq: Every day | ORAL | 0 refills | Status: AC
  Filled 2023-04-02: qty 30, 30d supply, fill #0

## 2023-01-08 MED ORDER — QELBREE 200 MG PO CP24
200.0000 mg | ORAL_CAPSULE | Freq: Two times a day (BID) | ORAL | 3 refills | Status: AC
  Filled 2023-01-08 – 2023-01-20 (×2): qty 60, 30d supply, fill #0
  Filled 2023-09-22: qty 60, 30d supply, fill #1
  Filled 2023-10-19: qty 60, 30d supply, fill #2
  Filled 2023-12-25: qty 60, 30d supply, fill #3

## 2023-01-08 MED ORDER — METHYLPHENIDATE HCL 5 MG PO TABS
5.0000 mg | ORAL_TABLET | Freq: Every day | ORAL | 0 refills | Status: AC
  Filled 2023-01-08 – 2023-02-02 (×2): qty 30, 30d supply, fill #0

## 2023-01-15 ENCOUNTER — Other Ambulatory Visit (HOSPITAL_BASED_OUTPATIENT_CLINIC_OR_DEPARTMENT_OTHER): Payer: Self-pay

## 2023-01-15 DIAGNOSIS — K5010 Crohn's disease of large intestine without complications: Secondary | ICD-10-CM | POA: Diagnosis not present

## 2023-01-16 ENCOUNTER — Other Ambulatory Visit (HOSPITAL_BASED_OUTPATIENT_CLINIC_OR_DEPARTMENT_OTHER): Payer: Self-pay

## 2023-01-20 ENCOUNTER — Other Ambulatory Visit (HOSPITAL_BASED_OUTPATIENT_CLINIC_OR_DEPARTMENT_OTHER): Payer: Self-pay

## 2023-01-22 DIAGNOSIS — F411 Generalized anxiety disorder: Secondary | ICD-10-CM | POA: Diagnosis not present

## 2023-02-02 ENCOUNTER — Other Ambulatory Visit (HOSPITAL_BASED_OUTPATIENT_CLINIC_OR_DEPARTMENT_OTHER): Payer: Self-pay

## 2023-02-05 DIAGNOSIS — F902 Attention-deficit hyperactivity disorder, combined type: Secondary | ICD-10-CM | POA: Diagnosis not present

## 2023-02-05 DIAGNOSIS — F411 Generalized anxiety disorder: Secondary | ICD-10-CM | POA: Diagnosis not present

## 2023-02-12 DIAGNOSIS — F411 Generalized anxiety disorder: Secondary | ICD-10-CM | POA: Diagnosis not present

## 2023-02-12 DIAGNOSIS — F4310 Post-traumatic stress disorder, unspecified: Secondary | ICD-10-CM | POA: Diagnosis not present

## 2023-02-12 DIAGNOSIS — F902 Attention-deficit hyperactivity disorder, combined type: Secondary | ICD-10-CM | POA: Diagnosis not present

## 2023-02-12 DIAGNOSIS — R5382 Chronic fatigue, unspecified: Secondary | ICD-10-CM | POA: Diagnosis not present

## 2023-02-12 DIAGNOSIS — F331 Major depressive disorder, recurrent, moderate: Secondary | ICD-10-CM | POA: Diagnosis not present

## 2023-02-16 DIAGNOSIS — K5010 Crohn's disease of large intestine without complications: Secondary | ICD-10-CM | POA: Diagnosis not present

## 2023-03-02 ENCOUNTER — Other Ambulatory Visit (HOSPITAL_BASED_OUTPATIENT_CLINIC_OR_DEPARTMENT_OTHER): Payer: Self-pay

## 2023-03-03 ENCOUNTER — Other Ambulatory Visit (HOSPITAL_BASED_OUTPATIENT_CLINIC_OR_DEPARTMENT_OTHER): Payer: Self-pay

## 2023-03-03 ENCOUNTER — Other Ambulatory Visit: Payer: Self-pay

## 2023-03-05 ENCOUNTER — Other Ambulatory Visit (HOSPITAL_BASED_OUTPATIENT_CLINIC_OR_DEPARTMENT_OTHER): Payer: Self-pay

## 2023-03-06 DIAGNOSIS — F411 Generalized anxiety disorder: Secondary | ICD-10-CM | POA: Diagnosis not present

## 2023-03-16 DIAGNOSIS — K5010 Crohn's disease of large intestine without complications: Secondary | ICD-10-CM | POA: Diagnosis not present

## 2023-03-19 ENCOUNTER — Other Ambulatory Visit (HOSPITAL_BASED_OUTPATIENT_CLINIC_OR_DEPARTMENT_OTHER): Payer: Self-pay

## 2023-03-19 DIAGNOSIS — F411 Generalized anxiety disorder: Secondary | ICD-10-CM | POA: Diagnosis not present

## 2023-03-19 DIAGNOSIS — F4310 Post-traumatic stress disorder, unspecified: Secondary | ICD-10-CM | POA: Diagnosis not present

## 2023-03-19 DIAGNOSIS — F331 Major depressive disorder, recurrent, moderate: Secondary | ICD-10-CM | POA: Diagnosis not present

## 2023-03-19 DIAGNOSIS — F902 Attention-deficit hyperactivity disorder, combined type: Secondary | ICD-10-CM | POA: Diagnosis not present

## 2023-03-19 MED ORDER — METHYLPHENIDATE HCL 5 MG PO TABS
5.0000 mg | ORAL_TABLET | Freq: Every day | ORAL | 0 refills | Status: DC
  Filled 2023-04-02: qty 30, 30d supply, fill #0

## 2023-03-19 MED ORDER — METHYLPHENIDATE HCL ER (OSM) 54 MG PO TBCR
54.0000 mg | EXTENDED_RELEASE_TABLET | Freq: Every day | ORAL | 0 refills | Status: AC
  Filled 2023-04-29 – 2023-05-01 (×2): qty 30, 30d supply, fill #0

## 2023-03-19 MED ORDER — QELBREE 200 MG PO CP24
200.0000 mg | ORAL_CAPSULE | Freq: Two times a day (BID) | ORAL | 3 refills | Status: AC
  Filled 2023-03-19: qty 60, 30d supply, fill #0
  Filled 2023-05-18: qty 60, 30d supply, fill #1
  Filled 2023-07-17: qty 60, 30d supply, fill #2
  Filled 2024-02-22: qty 60, 30d supply, fill #3

## 2023-03-27 DIAGNOSIS — F902 Attention-deficit hyperactivity disorder, combined type: Secondary | ICD-10-CM | POA: Diagnosis not present

## 2023-03-27 DIAGNOSIS — F4310 Post-traumatic stress disorder, unspecified: Secondary | ICD-10-CM | POA: Diagnosis not present

## 2023-03-27 DIAGNOSIS — Z5181 Encounter for therapeutic drug level monitoring: Secondary | ICD-10-CM | POA: Diagnosis not present

## 2023-03-27 DIAGNOSIS — E291 Testicular hypofunction: Secondary | ICD-10-CM | POA: Diagnosis not present

## 2023-03-27 DIAGNOSIS — R5382 Chronic fatigue, unspecified: Secondary | ICD-10-CM | POA: Diagnosis not present

## 2023-03-27 DIAGNOSIS — F411 Generalized anxiety disorder: Secondary | ICD-10-CM | POA: Diagnosis not present

## 2023-03-30 ENCOUNTER — Other Ambulatory Visit (HOSPITAL_BASED_OUTPATIENT_CLINIC_OR_DEPARTMENT_OTHER): Payer: Self-pay

## 2023-03-30 MED ORDER — VITAMIN D (ERGOCALCIFEROL) 1.25 MG (50000 UNIT) PO CAPS
50000.0000 [IU] | ORAL_CAPSULE | ORAL | 2 refills | Status: DC
  Filled 2023-03-30: qty 13, 90d supply, fill #0
  Filled 2023-06-08 – 2023-06-12 (×2): qty 13, 90d supply, fill #1
  Filled 2023-09-22: qty 13, 90d supply, fill #2

## 2023-03-30 MED ORDER — VITAMIN B-12 1000 MCG PO TABS
2000.0000 ug | ORAL_TABLET | Freq: Every day | ORAL | 1 refills | Status: AC
  Filled 2023-04-02: qty 90, 90d supply, fill #0
  Filled 2023-06-08: qty 100, 50d supply, fill #0
  Filled 2023-07-31: qty 100, 50d supply, fill #1

## 2023-04-02 ENCOUNTER — Other Ambulatory Visit (HOSPITAL_BASED_OUTPATIENT_CLINIC_OR_DEPARTMENT_OTHER): Payer: Self-pay

## 2023-04-02 ENCOUNTER — Other Ambulatory Visit: Payer: Self-pay

## 2023-04-03 ENCOUNTER — Other Ambulatory Visit (HOSPITAL_BASED_OUTPATIENT_CLINIC_OR_DEPARTMENT_OTHER): Payer: Self-pay

## 2023-04-03 ENCOUNTER — Other Ambulatory Visit: Payer: Self-pay

## 2023-04-03 DIAGNOSIS — R7989 Other specified abnormal findings of blood chemistry: Secondary | ICD-10-CM | POA: Diagnosis not present

## 2023-04-13 DIAGNOSIS — R7989 Other specified abnormal findings of blood chemistry: Secondary | ICD-10-CM | POA: Diagnosis not present

## 2023-04-13 DIAGNOSIS — K5010 Crohn's disease of large intestine without complications: Secondary | ICD-10-CM | POA: Diagnosis not present

## 2023-04-14 DIAGNOSIS — R7989 Other specified abnormal findings of blood chemistry: Secondary | ICD-10-CM | POA: Diagnosis not present

## 2023-04-27 DIAGNOSIS — F331 Major depressive disorder, recurrent, moderate: Secondary | ICD-10-CM | POA: Diagnosis not present

## 2023-04-27 DIAGNOSIS — F411 Generalized anxiety disorder: Secondary | ICD-10-CM | POA: Diagnosis not present

## 2023-04-27 DIAGNOSIS — F902 Attention-deficit hyperactivity disorder, combined type: Secondary | ICD-10-CM | POA: Diagnosis not present

## 2023-04-29 ENCOUNTER — Other Ambulatory Visit (HOSPITAL_BASED_OUTPATIENT_CLINIC_OR_DEPARTMENT_OTHER): Payer: Self-pay

## 2023-05-01 ENCOUNTER — Other Ambulatory Visit (HOSPITAL_BASED_OUTPATIENT_CLINIC_OR_DEPARTMENT_OTHER): Payer: Self-pay

## 2023-05-01 ENCOUNTER — Other Ambulatory Visit: Payer: Self-pay

## 2023-05-01 DIAGNOSIS — K5000 Crohn's disease of small intestine without complications: Secondary | ICD-10-CM | POA: Diagnosis not present

## 2023-05-01 DIAGNOSIS — K5010 Crohn's disease of large intestine without complications: Secondary | ICD-10-CM | POA: Diagnosis not present

## 2023-05-01 MED ORDER — METHYLPHENIDATE HCL ER (OSM) 54 MG PO TBCR
54.0000 mg | EXTENDED_RELEASE_TABLET | Freq: Every day | ORAL | 0 refills | Status: DC
  Filled 2023-05-01 – 2023-05-30 (×2): qty 30, 30d supply, fill #0

## 2023-05-03 DIAGNOSIS — R000 Tachycardia, unspecified: Secondary | ICD-10-CM | POA: Diagnosis not present

## 2023-05-03 DIAGNOSIS — R7989 Other specified abnormal findings of blood chemistry: Secondary | ICD-10-CM | POA: Diagnosis not present

## 2023-05-03 DIAGNOSIS — R002 Palpitations: Secondary | ICD-10-CM | POA: Diagnosis not present

## 2023-05-04 ENCOUNTER — Other Ambulatory Visit (HOSPITAL_BASED_OUTPATIENT_CLINIC_OR_DEPARTMENT_OTHER): Payer: Self-pay

## 2023-05-04 MED ORDER — METHYLPHENIDATE HCL 5 MG PO TABS
5.0000 mg | ORAL_TABLET | Freq: Every day | ORAL | 0 refills | Status: DC
  Filled 2023-05-04: qty 10, 10d supply, fill #0
  Filled 2023-05-04: qty 20, 20d supply, fill #0

## 2023-05-08 DIAGNOSIS — L659 Nonscarring hair loss, unspecified: Secondary | ICD-10-CM | POA: Diagnosis not present

## 2023-05-08 DIAGNOSIS — F32A Depression, unspecified: Secondary | ICD-10-CM | POA: Diagnosis not present

## 2023-05-08 DIAGNOSIS — I10 Essential (primary) hypertension: Secondary | ICD-10-CM | POA: Diagnosis not present

## 2023-05-08 DIAGNOSIS — R946 Abnormal results of thyroid function studies: Secondary | ICD-10-CM | POA: Diagnosis not present

## 2023-05-08 DIAGNOSIS — R5382 Chronic fatigue, unspecified: Secondary | ICD-10-CM | POA: Diagnosis not present

## 2023-05-08 DIAGNOSIS — F419 Anxiety disorder, unspecified: Secondary | ICD-10-CM | POA: Diagnosis not present

## 2023-05-08 DIAGNOSIS — R000 Tachycardia, unspecified: Secondary | ICD-10-CM | POA: Diagnosis not present

## 2023-05-11 DIAGNOSIS — R6889 Other general symptoms and signs: Secondary | ICD-10-CM | POA: Diagnosis not present

## 2023-05-11 DIAGNOSIS — K5010 Crohn's disease of large intestine without complications: Secondary | ICD-10-CM | POA: Diagnosis not present

## 2023-05-18 ENCOUNTER — Other Ambulatory Visit (HOSPITAL_BASED_OUTPATIENT_CLINIC_OR_DEPARTMENT_OTHER): Payer: Self-pay

## 2023-05-18 DIAGNOSIS — F411 Generalized anxiety disorder: Secondary | ICD-10-CM | POA: Diagnosis not present

## 2023-05-18 DIAGNOSIS — F902 Attention-deficit hyperactivity disorder, combined type: Secondary | ICD-10-CM | POA: Diagnosis not present

## 2023-05-30 ENCOUNTER — Other Ambulatory Visit (HOSPITAL_BASED_OUTPATIENT_CLINIC_OR_DEPARTMENT_OTHER): Payer: Self-pay

## 2023-06-08 ENCOUNTER — Other Ambulatory Visit (HOSPITAL_BASED_OUTPATIENT_CLINIC_OR_DEPARTMENT_OTHER): Payer: Self-pay

## 2023-06-08 ENCOUNTER — Other Ambulatory Visit: Payer: Self-pay

## 2023-06-08 DIAGNOSIS — R000 Tachycardia, unspecified: Secondary | ICD-10-CM | POA: Diagnosis not present

## 2023-06-08 DIAGNOSIS — K5010 Crohn's disease of large intestine without complications: Secondary | ICD-10-CM | POA: Diagnosis not present

## 2023-06-09 ENCOUNTER — Other Ambulatory Visit (HOSPITAL_BASED_OUTPATIENT_CLINIC_OR_DEPARTMENT_OTHER): Payer: Self-pay

## 2023-06-10 ENCOUNTER — Other Ambulatory Visit (HOSPITAL_BASED_OUTPATIENT_CLINIC_OR_DEPARTMENT_OTHER): Payer: Self-pay

## 2023-06-12 ENCOUNTER — Other Ambulatory Visit (HOSPITAL_BASED_OUTPATIENT_CLINIC_OR_DEPARTMENT_OTHER): Payer: Self-pay

## 2023-06-12 DIAGNOSIS — F411 Generalized anxiety disorder: Secondary | ICD-10-CM | POA: Diagnosis not present

## 2023-06-12 DIAGNOSIS — F331 Major depressive disorder, recurrent, moderate: Secondary | ICD-10-CM | POA: Diagnosis not present

## 2023-06-12 DIAGNOSIS — F902 Attention-deficit hyperactivity disorder, combined type: Secondary | ICD-10-CM | POA: Diagnosis not present

## 2023-06-12 DIAGNOSIS — R5382 Chronic fatigue, unspecified: Secondary | ICD-10-CM | POA: Diagnosis not present

## 2023-06-12 DIAGNOSIS — F4310 Post-traumatic stress disorder, unspecified: Secondary | ICD-10-CM | POA: Diagnosis not present

## 2023-06-12 MED ORDER — METHYLPHENIDATE HCL ER (OSM) 54 MG PO TBCR
54.0000 mg | EXTENDED_RELEASE_TABLET | Freq: Every day | ORAL | 0 refills | Status: AC
  Filled 2023-06-12 – 2023-06-29 (×2): qty 30, 30d supply, fill #0

## 2023-06-12 MED ORDER — METHYLPHENIDATE HCL 5 MG PO TABS
5.0000 mg | ORAL_TABLET | Freq: Every day | ORAL | 0 refills | Status: AC
  Filled 2023-06-12: qty 30, 30d supply, fill #0

## 2023-06-29 ENCOUNTER — Other Ambulatory Visit (HOSPITAL_BASED_OUTPATIENT_CLINIC_OR_DEPARTMENT_OTHER): Payer: Self-pay

## 2023-06-30 ENCOUNTER — Other Ambulatory Visit: Payer: Self-pay

## 2023-06-30 ENCOUNTER — Other Ambulatory Visit (HOSPITAL_BASED_OUTPATIENT_CLINIC_OR_DEPARTMENT_OTHER): Payer: Self-pay

## 2023-06-30 DIAGNOSIS — F411 Generalized anxiety disorder: Secondary | ICD-10-CM | POA: Diagnosis not present

## 2023-06-30 DIAGNOSIS — F331 Major depressive disorder, recurrent, moderate: Secondary | ICD-10-CM | POA: Diagnosis not present

## 2023-06-30 DIAGNOSIS — F902 Attention-deficit hyperactivity disorder, combined type: Secondary | ICD-10-CM | POA: Diagnosis not present

## 2023-07-08 DIAGNOSIS — K5010 Crohn's disease of large intestine without complications: Secondary | ICD-10-CM | POA: Diagnosis not present

## 2023-07-10 ENCOUNTER — Other Ambulatory Visit (HOSPITAL_BASED_OUTPATIENT_CLINIC_OR_DEPARTMENT_OTHER): Payer: Self-pay

## 2023-07-10 DIAGNOSIS — F4310 Post-traumatic stress disorder, unspecified: Secondary | ICD-10-CM | POA: Diagnosis not present

## 2023-07-10 DIAGNOSIS — R5382 Chronic fatigue, unspecified: Secondary | ICD-10-CM | POA: Diagnosis not present

## 2023-07-10 DIAGNOSIS — F411 Generalized anxiety disorder: Secondary | ICD-10-CM | POA: Diagnosis not present

## 2023-07-10 DIAGNOSIS — F902 Attention-deficit hyperactivity disorder, combined type: Secondary | ICD-10-CM | POA: Diagnosis not present

## 2023-07-10 DIAGNOSIS — F331 Major depressive disorder, recurrent, moderate: Secondary | ICD-10-CM | POA: Diagnosis not present

## 2023-07-10 MED ORDER — METHYLPHENIDATE HCL 5 MG PO TABS
5.0000 mg | ORAL_TABLET | Freq: Every day | ORAL | 0 refills | Status: DC
  Filled 2023-07-17: qty 30, 30d supply, fill #0

## 2023-07-10 MED ORDER — METHYLPHENIDATE HCL ER (OSM) 54 MG PO TBCR
54.0000 mg | EXTENDED_RELEASE_TABLET | Freq: Every day | ORAL | 0 refills | Status: AC
  Filled 2023-11-25: qty 30, 30d supply, fill #0

## 2023-07-17 ENCOUNTER — Other Ambulatory Visit (HOSPITAL_BASED_OUTPATIENT_CLINIC_OR_DEPARTMENT_OTHER): Payer: Self-pay

## 2023-07-21 DIAGNOSIS — F411 Generalized anxiety disorder: Secondary | ICD-10-CM | POA: Diagnosis not present

## 2023-08-03 ENCOUNTER — Other Ambulatory Visit (HOSPITAL_BASED_OUTPATIENT_CLINIC_OR_DEPARTMENT_OTHER): Payer: Self-pay

## 2023-08-05 DIAGNOSIS — K5010 Crohn's disease of large intestine without complications: Secondary | ICD-10-CM | POA: Diagnosis not present

## 2023-08-07 ENCOUNTER — Other Ambulatory Visit (HOSPITAL_BASED_OUTPATIENT_CLINIC_OR_DEPARTMENT_OTHER): Payer: Self-pay

## 2023-08-07 DIAGNOSIS — F902 Attention-deficit hyperactivity disorder, combined type: Secondary | ICD-10-CM | POA: Diagnosis not present

## 2023-08-07 DIAGNOSIS — F4310 Post-traumatic stress disorder, unspecified: Secondary | ICD-10-CM | POA: Diagnosis not present

## 2023-08-07 DIAGNOSIS — F411 Generalized anxiety disorder: Secondary | ICD-10-CM | POA: Diagnosis not present

## 2023-08-07 DIAGNOSIS — F331 Major depressive disorder, recurrent, moderate: Secondary | ICD-10-CM | POA: Diagnosis not present

## 2023-08-07 MED ORDER — METHYLPHENIDATE HCL ER (OSM) 54 MG PO TBCR
54.0000 mg | EXTENDED_RELEASE_TABLET | Freq: Every day | ORAL | 0 refills | Status: AC
  Filled 2023-08-07: qty 30, 30d supply, fill #0

## 2023-08-07 MED ORDER — METHYLPHENIDATE HCL 5 MG PO TABS
5.0000 mg | ORAL_TABLET | Freq: Every day | ORAL | 0 refills | Status: AC
  Filled 2023-08-07: qty 30, 30d supply, fill #0

## 2023-08-10 ENCOUNTER — Other Ambulatory Visit (HOSPITAL_BASED_OUTPATIENT_CLINIC_OR_DEPARTMENT_OTHER): Payer: Self-pay

## 2023-08-10 MED ORDER — INFLUENZA VIRUS VACC SPLIT PF (FLUZONE) 0.5 ML IM SUSY
0.5000 mL | PREFILLED_SYRINGE | Freq: Once | INTRAMUSCULAR | 0 refills | Status: AC
  Filled 2023-08-10: qty 0.5, 1d supply, fill #0

## 2023-09-02 DIAGNOSIS — K5010 Crohn's disease of large intestine without complications: Secondary | ICD-10-CM | POA: Diagnosis not present

## 2023-09-04 ENCOUNTER — Other Ambulatory Visit (HOSPITAL_BASED_OUTPATIENT_CLINIC_OR_DEPARTMENT_OTHER): Payer: Self-pay

## 2023-09-04 DIAGNOSIS — F411 Generalized anxiety disorder: Secondary | ICD-10-CM | POA: Diagnosis not present

## 2023-09-04 DIAGNOSIS — F902 Attention-deficit hyperactivity disorder, combined type: Secondary | ICD-10-CM | POA: Diagnosis not present

## 2023-09-04 DIAGNOSIS — F331 Major depressive disorder, recurrent, moderate: Secondary | ICD-10-CM | POA: Diagnosis not present

## 2023-09-04 DIAGNOSIS — F4310 Post-traumatic stress disorder, unspecified: Secondary | ICD-10-CM | POA: Diagnosis not present

## 2023-09-04 MED ORDER — VILAZODONE HCL 40 MG PO TABS
40.0000 mg | ORAL_TABLET | Freq: Every day | ORAL | 2 refills | Status: AC
  Filled 2023-10-05: qty 90, 90d supply, fill #0
  Filled 2024-01-04: qty 90, 90d supply, fill #1
  Filled 2024-03-30: qty 90, 90d supply, fill #2

## 2023-09-04 MED ORDER — METHYLPHENIDATE HCL 5 MG PO TABS
5.0000 mg | ORAL_TABLET | Freq: Every day | ORAL | 0 refills | Status: AC
  Filled 2023-09-04: qty 30, 30d supply, fill #0

## 2023-09-04 MED ORDER — AUVELITY 45-105 MG PO TBCR
1.0000 | EXTENDED_RELEASE_TABLET | Freq: Two times a day (BID) | ORAL | 3 refills | Status: AC
Start: 2023-09-04 — End: ?
  Filled 2023-09-04: qty 180, 90d supply, fill #0
  Filled 2023-12-16: qty 180, 90d supply, fill #1
  Filled 2024-03-15: qty 180, 90d supply, fill #2

## 2023-09-04 MED ORDER — METHYLPHENIDATE HCL ER (OSM) 54 MG PO TBCR
54.0000 mg | EXTENDED_RELEASE_TABLET | Freq: Every day | ORAL | 0 refills | Status: AC
  Filled 2023-09-22: qty 30, 30d supply, fill #0

## 2023-09-04 MED ORDER — VITAMIN D3 50 MCG (2000 UT) PO CAPS
2000.0000 [IU] | ORAL_CAPSULE | Freq: Every day | ORAL | 1 refills | Status: AC
  Filled 2023-09-04: qty 100, 100d supply, fill #0
  Filled 2023-12-16: qty 100, 100d supply, fill #1

## 2023-09-22 ENCOUNTER — Other Ambulatory Visit (HOSPITAL_BASED_OUTPATIENT_CLINIC_OR_DEPARTMENT_OTHER): Payer: Self-pay

## 2023-09-22 ENCOUNTER — Other Ambulatory Visit: Payer: Self-pay

## 2023-10-01 DIAGNOSIS — K5010 Crohn's disease of large intestine without complications: Secondary | ICD-10-CM | POA: Diagnosis not present

## 2023-10-02 ENCOUNTER — Other Ambulatory Visit (HOSPITAL_BASED_OUTPATIENT_CLINIC_OR_DEPARTMENT_OTHER): Payer: Self-pay

## 2023-10-02 DIAGNOSIS — F331 Major depressive disorder, recurrent, moderate: Secondary | ICD-10-CM | POA: Diagnosis not present

## 2023-10-02 DIAGNOSIS — F4310 Post-traumatic stress disorder, unspecified: Secondary | ICD-10-CM | POA: Diagnosis not present

## 2023-10-02 DIAGNOSIS — F411 Generalized anxiety disorder: Secondary | ICD-10-CM | POA: Diagnosis not present

## 2023-10-02 DIAGNOSIS — F902 Attention-deficit hyperactivity disorder, combined type: Secondary | ICD-10-CM | POA: Diagnosis not present

## 2023-10-02 MED ORDER — VITAMIN D (ERGOCALCIFEROL) 1.25 MG (50000 UNIT) PO CAPS
50000.0000 [IU] | ORAL_CAPSULE | ORAL | 2 refills | Status: AC
  Filled 2023-10-02: qty 13, 91d supply, fill #0
  Filled 2023-12-25: qty 12, 84d supply, fill #0
  Filled 2024-03-15: qty 12, 84d supply, fill #1
  Filled 2024-06-05: qty 12, 84d supply, fill #2
  Filled 2024-09-08: qty 3, 21d supply, fill #3

## 2023-10-02 MED ORDER — METHYLPHENIDATE HCL ER (OSM) 54 MG PO TBCR
54.0000 mg | EXTENDED_RELEASE_TABLET | Freq: Every day | ORAL | 0 refills | Status: AC
Start: 2023-10-02 — End: ?
  Filled 2023-10-25: qty 30, 30d supply, fill #0

## 2023-10-02 MED ORDER — METHYLPHENIDATE HCL ER (OSM) 54 MG PO TBCR
54.0000 mg | EXTENDED_RELEASE_TABLET | Freq: Every day | ORAL | 0 refills | Status: DC
  Filled 2024-03-28: qty 30, 30d supply, fill #0

## 2023-10-02 MED ORDER — METHYLPHENIDATE HCL 5 MG PO TABS
5.0000 mg | ORAL_TABLET | Freq: Every day | ORAL | 0 refills | Status: DC
  Filled 2023-10-02: qty 30, 30d supply, fill #0

## 2023-10-02 MED ORDER — CVS VITAMIN B12 1000 MCG PO TBCR
2000.0000 ug | EXTENDED_RELEASE_TABLET | Freq: Every day | ORAL | 2 refills | Status: AC
  Filled 2023-10-02: qty 180, 90d supply, fill #0
  Filled 2024-01-04: qty 180, 90d supply, fill #1
  Filled 2024-04-30: qty 180, 90d supply, fill #2

## 2023-10-02 MED ORDER — METHYLPHENIDATE HCL 5 MG PO TABS
5.0000 mg | ORAL_TABLET | Freq: Every day | ORAL | 0 refills | Status: AC
Start: 2023-10-02 — End: ?
  Filled 2023-10-19 – 2023-10-20 (×2): qty 30, 30d supply, fill #0

## 2023-10-05 ENCOUNTER — Other Ambulatory Visit: Payer: Self-pay

## 2023-10-05 ENCOUNTER — Other Ambulatory Visit (HOSPITAL_BASED_OUTPATIENT_CLINIC_OR_DEPARTMENT_OTHER): Payer: Self-pay

## 2023-10-19 ENCOUNTER — Other Ambulatory Visit (HOSPITAL_BASED_OUTPATIENT_CLINIC_OR_DEPARTMENT_OTHER): Payer: Self-pay

## 2023-10-19 ENCOUNTER — Other Ambulatory Visit: Payer: Self-pay

## 2023-10-20 ENCOUNTER — Other Ambulatory Visit (HOSPITAL_BASED_OUTPATIENT_CLINIC_OR_DEPARTMENT_OTHER): Payer: Self-pay

## 2023-10-20 ENCOUNTER — Other Ambulatory Visit: Payer: Self-pay

## 2023-10-25 ENCOUNTER — Other Ambulatory Visit (HOSPITAL_BASED_OUTPATIENT_CLINIC_OR_DEPARTMENT_OTHER): Payer: Self-pay

## 2023-10-26 ENCOUNTER — Other Ambulatory Visit (HOSPITAL_BASED_OUTPATIENT_CLINIC_OR_DEPARTMENT_OTHER): Payer: Self-pay

## 2023-10-27 DIAGNOSIS — Z0100 Encounter for examination of eyes and vision without abnormal findings: Secondary | ICD-10-CM | POA: Diagnosis not present

## 2023-11-03 DIAGNOSIS — K5010 Crohn's disease of large intestine without complications: Secondary | ICD-10-CM | POA: Diagnosis not present

## 2023-11-04 ENCOUNTER — Other Ambulatory Visit (HOSPITAL_BASED_OUTPATIENT_CLINIC_OR_DEPARTMENT_OTHER): Payer: Self-pay

## 2023-11-04 MED ORDER — FINASTERIDE 1 MG PO TABS
1.0000 mg | ORAL_TABLET | Freq: Every day | ORAL | 3 refills | Status: DC
  Filled 2023-11-04: qty 90, 90d supply, fill #0
  Filled 2024-02-22: qty 90, 90d supply, fill #1
  Filled 2024-03-15 – 2024-03-28 (×2): qty 90, 90d supply, fill #2
  Filled 2024-09-08: qty 90, 90d supply, fill #3

## 2023-11-04 MED ORDER — MINOXIDIL 2.5 MG PO TABS
1.2500 mg | ORAL_TABLET | Freq: Every day | ORAL | 3 refills | Status: AC
  Filled 2023-11-04: qty 45, 90d supply, fill #0
  Filled 2024-01-25: qty 45, 90d supply, fill #1
  Filled 2024-03-15 – 2024-04-22 (×2): qty 45, 90d supply, fill #2
  Filled 2024-08-08: qty 45, 90d supply, fill #3

## 2023-11-13 ENCOUNTER — Other Ambulatory Visit (HOSPITAL_BASED_OUTPATIENT_CLINIC_OR_DEPARTMENT_OTHER): Payer: Self-pay

## 2023-11-13 DIAGNOSIS — F902 Attention-deficit hyperactivity disorder, combined type: Secondary | ICD-10-CM | POA: Diagnosis not present

## 2023-11-13 DIAGNOSIS — F4310 Post-traumatic stress disorder, unspecified: Secondary | ICD-10-CM | POA: Diagnosis not present

## 2023-11-13 DIAGNOSIS — F331 Major depressive disorder, recurrent, moderate: Secondary | ICD-10-CM | POA: Diagnosis not present

## 2023-11-13 DIAGNOSIS — F411 Generalized anxiety disorder: Secondary | ICD-10-CM | POA: Diagnosis not present

## 2023-11-13 MED ORDER — METHYLPHENIDATE HCL ER (OSM) 54 MG PO TBCR
54.0000 mg | EXTENDED_RELEASE_TABLET | Freq: Every day | ORAL | 0 refills | Status: AC
  Filled 2023-11-13 – 2023-12-25 (×3): qty 30, 30d supply, fill #0

## 2023-11-13 MED ORDER — QELBREE 200 MG PO CP24
200.0000 mg | ORAL_CAPSULE | Freq: Two times a day (BID) | ORAL | 2 refills | Status: AC
  Filled 2023-11-13: qty 60, 30d supply, fill #0
  Filled 2024-03-15 – 2024-03-23 (×2): qty 60, 30d supply, fill #1
  Filled 2024-04-30: qty 60, 30d supply, fill #2

## 2023-11-25 ENCOUNTER — Other Ambulatory Visit (HOSPITAL_BASED_OUTPATIENT_CLINIC_OR_DEPARTMENT_OTHER): Payer: Self-pay

## 2023-12-02 DIAGNOSIS — K5010 Crohn's disease of large intestine without complications: Secondary | ICD-10-CM | POA: Diagnosis not present

## 2023-12-16 ENCOUNTER — Other Ambulatory Visit (HOSPITAL_BASED_OUTPATIENT_CLINIC_OR_DEPARTMENT_OTHER): Payer: Self-pay

## 2023-12-16 ENCOUNTER — Other Ambulatory Visit: Payer: Self-pay

## 2023-12-25 ENCOUNTER — Other Ambulatory Visit (HOSPITAL_BASED_OUTPATIENT_CLINIC_OR_DEPARTMENT_OTHER): Payer: Self-pay

## 2023-12-25 ENCOUNTER — Other Ambulatory Visit: Payer: Self-pay

## 2023-12-30 DIAGNOSIS — K5010 Crohn's disease of large intestine without complications: Secondary | ICD-10-CM | POA: Diagnosis not present

## 2024-01-05 ENCOUNTER — Other Ambulatory Visit (HOSPITAL_BASED_OUTPATIENT_CLINIC_OR_DEPARTMENT_OTHER): Payer: Self-pay

## 2024-01-08 ENCOUNTER — Other Ambulatory Visit (HOSPITAL_BASED_OUTPATIENT_CLINIC_OR_DEPARTMENT_OTHER): Payer: Self-pay

## 2024-01-08 DIAGNOSIS — F4310 Post-traumatic stress disorder, unspecified: Secondary | ICD-10-CM | POA: Diagnosis not present

## 2024-01-08 DIAGNOSIS — F902 Attention-deficit hyperactivity disorder, combined type: Secondary | ICD-10-CM | POA: Diagnosis not present

## 2024-01-08 DIAGNOSIS — F411 Generalized anxiety disorder: Secondary | ICD-10-CM | POA: Diagnosis not present

## 2024-01-08 DIAGNOSIS — F331 Major depressive disorder, recurrent, moderate: Secondary | ICD-10-CM | POA: Diagnosis not present

## 2024-01-08 MED ORDER — QELBREE 200 MG PO CP24
200.0000 mg | ORAL_CAPSULE | Freq: Two times a day (BID) | ORAL | 2 refills | Status: AC
  Filled 2024-01-08 – 2024-06-05 (×2): qty 60, 30d supply, fill #0
  Filled 2024-10-05 – 2024-11-04 (×2): qty 60, 30d supply, fill #1

## 2024-01-08 MED ORDER — METHYLPHENIDATE HCL ER (OSM) 54 MG PO TBCR
54.0000 mg | EXTENDED_RELEASE_TABLET | Freq: Every day | ORAL | 0 refills | Status: AC
  Filled 2024-01-25 – 2024-02-26 (×3): qty 30, 30d supply, fill #0

## 2024-01-08 MED ORDER — METHYLPHENIDATE HCL 5 MG PO TABS: 5.0000 mg | ORAL_TABLET | Freq: Every day | ORAL | 0 refills | Status: AC

## 2024-01-08 MED ORDER — METHYLPHENIDATE HCL ER (OSM) 54 MG PO TBCR
54.0000 mg | EXTENDED_RELEASE_TABLET | Freq: Every day | ORAL | 0 refills | Status: AC
  Filled 2024-01-08 – 2024-01-29 (×2): qty 30, 30d supply, fill #0

## 2024-01-25 ENCOUNTER — Other Ambulatory Visit: Payer: Self-pay

## 2024-01-25 ENCOUNTER — Other Ambulatory Visit (HOSPITAL_BASED_OUTPATIENT_CLINIC_OR_DEPARTMENT_OTHER): Payer: Self-pay

## 2024-01-27 DIAGNOSIS — K5010 Crohn's disease of large intestine without complications: Secondary | ICD-10-CM | POA: Diagnosis not present

## 2024-01-29 ENCOUNTER — Other Ambulatory Visit (HOSPITAL_BASED_OUTPATIENT_CLINIC_OR_DEPARTMENT_OTHER): Payer: Self-pay

## 2024-02-22 ENCOUNTER — Other Ambulatory Visit (HOSPITAL_BASED_OUTPATIENT_CLINIC_OR_DEPARTMENT_OTHER): Payer: Self-pay

## 2024-02-23 ENCOUNTER — Other Ambulatory Visit (HOSPITAL_BASED_OUTPATIENT_CLINIC_OR_DEPARTMENT_OTHER): Payer: Self-pay

## 2024-02-23 ENCOUNTER — Other Ambulatory Visit: Payer: Self-pay

## 2024-02-26 ENCOUNTER — Other Ambulatory Visit (HOSPITAL_BASED_OUTPATIENT_CLINIC_OR_DEPARTMENT_OTHER): Payer: Self-pay

## 2024-02-26 ENCOUNTER — Other Ambulatory Visit: Payer: Self-pay

## 2024-03-02 DIAGNOSIS — K5010 Crohn's disease of large intestine without complications: Secondary | ICD-10-CM | POA: Diagnosis not present

## 2024-03-11 DIAGNOSIS — M542 Cervicalgia: Secondary | ICD-10-CM | POA: Diagnosis not present

## 2024-03-11 DIAGNOSIS — R519 Headache, unspecified: Secondary | ICD-10-CM | POA: Diagnosis not present

## 2024-03-15 ENCOUNTER — Other Ambulatory Visit (HOSPITAL_BASED_OUTPATIENT_CLINIC_OR_DEPARTMENT_OTHER): Payer: Self-pay

## 2024-03-16 ENCOUNTER — Other Ambulatory Visit (HOSPITAL_BASED_OUTPATIENT_CLINIC_OR_DEPARTMENT_OTHER): Payer: Self-pay

## 2024-03-18 ENCOUNTER — Other Ambulatory Visit (HOSPITAL_BASED_OUTPATIENT_CLINIC_OR_DEPARTMENT_OTHER): Payer: Self-pay

## 2024-03-18 DIAGNOSIS — Z1211 Encounter for screening for malignant neoplasm of colon: Secondary | ICD-10-CM | POA: Diagnosis not present

## 2024-03-18 DIAGNOSIS — Z9189 Other specified personal risk factors, not elsewhere classified: Secondary | ICD-10-CM | POA: Diagnosis not present

## 2024-03-18 DIAGNOSIS — Z1212 Encounter for screening for malignant neoplasm of rectum: Secondary | ICD-10-CM | POA: Diagnosis not present

## 2024-03-18 DIAGNOSIS — K5010 Crohn's disease of large intestine without complications: Secondary | ICD-10-CM | POA: Diagnosis not present

## 2024-03-23 ENCOUNTER — Other Ambulatory Visit (HOSPITAL_BASED_OUTPATIENT_CLINIC_OR_DEPARTMENT_OTHER): Payer: Self-pay

## 2024-03-23 ENCOUNTER — Other Ambulatory Visit: Payer: Self-pay

## 2024-03-24 DIAGNOSIS — G4486 Cervicogenic headache: Secondary | ICD-10-CM | POA: Diagnosis not present

## 2024-03-28 ENCOUNTER — Other Ambulatory Visit: Payer: Self-pay

## 2024-03-28 ENCOUNTER — Other Ambulatory Visit (HOSPITAL_BASED_OUTPATIENT_CLINIC_OR_DEPARTMENT_OTHER): Payer: Self-pay

## 2024-03-30 DIAGNOSIS — K5010 Crohn's disease of large intestine without complications: Secondary | ICD-10-CM | POA: Diagnosis not present

## 2024-04-27 DIAGNOSIS — K5010 Crohn's disease of large intestine without complications: Secondary | ICD-10-CM | POA: Diagnosis not present

## 2024-04-30 ENCOUNTER — Other Ambulatory Visit (HOSPITAL_BASED_OUTPATIENT_CLINIC_OR_DEPARTMENT_OTHER): Payer: Self-pay

## 2024-05-02 ENCOUNTER — Other Ambulatory Visit (HOSPITAL_BASED_OUTPATIENT_CLINIC_OR_DEPARTMENT_OTHER): Payer: Self-pay

## 2024-05-02 ENCOUNTER — Other Ambulatory Visit: Payer: Self-pay

## 2024-05-03 ENCOUNTER — Other Ambulatory Visit (HOSPITAL_BASED_OUTPATIENT_CLINIC_OR_DEPARTMENT_OTHER): Payer: Self-pay

## 2024-05-03 ENCOUNTER — Other Ambulatory Visit: Payer: Self-pay

## 2024-05-04 ENCOUNTER — Other Ambulatory Visit (HOSPITAL_BASED_OUTPATIENT_CLINIC_OR_DEPARTMENT_OTHER): Payer: Self-pay

## 2024-05-04 MED ORDER — METHYLPHENIDATE HCL ER (OSM) 54 MG PO TBCR
54.0000 mg | EXTENDED_RELEASE_TABLET | Freq: Every day | ORAL | 0 refills | Status: DC
  Filled 2024-05-04: qty 30, 30d supply, fill #0

## 2024-05-04 MED ORDER — QELBREE 200 MG PO CP24: 200.0000 mg | ORAL_CAPSULE | Freq: Two times a day (BID) | ORAL | 2 refills | Status: AC

## 2024-05-25 DIAGNOSIS — K5010 Crohn's disease of large intestine without complications: Secondary | ICD-10-CM | POA: Diagnosis not present

## 2024-06-05 ENCOUNTER — Other Ambulatory Visit (HOSPITAL_BASED_OUTPATIENT_CLINIC_OR_DEPARTMENT_OTHER): Payer: Self-pay

## 2024-06-06 ENCOUNTER — Other Ambulatory Visit: Payer: Self-pay

## 2024-06-07 ENCOUNTER — Other Ambulatory Visit (HOSPITAL_BASED_OUTPATIENT_CLINIC_OR_DEPARTMENT_OTHER): Payer: Self-pay

## 2024-06-07 MED ORDER — METHYLPHENIDATE HCL ER (OSM) 54 MG PO TBCR
54.0000 mg | EXTENDED_RELEASE_TABLET | Freq: Every day | ORAL | 0 refills | Status: DC
  Filled 2024-06-07: qty 30, 30d supply, fill #0

## 2024-06-20 DIAGNOSIS — G4486 Cervicogenic headache: Secondary | ICD-10-CM | POA: Diagnosis not present

## 2024-06-22 DIAGNOSIS — K5010 Crohn's disease of large intestine without complications: Secondary | ICD-10-CM | POA: Diagnosis not present

## 2024-06-30 ENCOUNTER — Other Ambulatory Visit (HOSPITAL_BASED_OUTPATIENT_CLINIC_OR_DEPARTMENT_OTHER): Payer: Self-pay

## 2024-06-30 MED ORDER — QELBREE 200 MG PO CP24
200.0000 mg | ORAL_CAPSULE | Freq: Two times a day (BID) | ORAL | 2 refills | Status: AC
  Filled 2024-06-30 – 2024-07-07 (×2): qty 180, 90d supply, fill #0

## 2024-06-30 MED ORDER — AUVELITY 45-105 MG PO TBCR
1.0000 | EXTENDED_RELEASE_TABLET | Freq: Two times a day (BID) | ORAL | 3 refills | Status: AC
  Filled 2024-06-30: qty 180, 90d supply, fill #0
  Filled 2024-10-05 – 2024-11-04 (×2): qty 180, 90d supply, fill #1

## 2024-06-30 MED ORDER — VILAZODONE HCL 40 MG PO TABS
40.0000 mg | ORAL_TABLET | Freq: Every day | ORAL | 2 refills | Status: AC
  Filled 2024-06-30: qty 90, 90d supply, fill #0
  Filled 2024-10-05: qty 30, 30d supply, fill #1
  Filled 2024-11-04 (×2): qty 30, 30d supply, fill #2

## 2024-06-30 MED ORDER — METHYLPHENIDATE HCL ER (OSM) 54 MG PO TBCR
54.0000 mg | EXTENDED_RELEASE_TABLET | Freq: Every day | ORAL | 0 refills | Status: AC
  Filled 2024-08-08: qty 30, 30d supply, fill #0

## 2024-06-30 MED ORDER — METHYLPHENIDATE HCL ER (OSM) 54 MG PO TBCR
54.0000 mg | EXTENDED_RELEASE_TABLET | Freq: Every day | ORAL | 0 refills | Status: DC
  Filled 2024-09-08: qty 30, 30d supply, fill #0

## 2024-06-30 MED ORDER — METHYLPHENIDATE HCL ER (OSM) 54 MG PO TBCR
54.0000 mg | EXTENDED_RELEASE_TABLET | Freq: Every day | ORAL | 0 refills | Status: AC
  Filled 2024-06-30 – 2024-07-08 (×2): qty 30, 30d supply, fill #0

## 2024-07-06 ENCOUNTER — Other Ambulatory Visit (HOSPITAL_BASED_OUTPATIENT_CLINIC_OR_DEPARTMENT_OTHER): Payer: Self-pay

## 2024-07-07 ENCOUNTER — Other Ambulatory Visit (HOSPITAL_BASED_OUTPATIENT_CLINIC_OR_DEPARTMENT_OTHER): Payer: Self-pay

## 2024-07-07 ENCOUNTER — Other Ambulatory Visit: Payer: Self-pay

## 2024-07-08 ENCOUNTER — Other Ambulatory Visit (HOSPITAL_BASED_OUTPATIENT_CLINIC_OR_DEPARTMENT_OTHER): Payer: Self-pay

## 2024-07-20 DIAGNOSIS — K5010 Crohn's disease of large intestine without complications: Secondary | ICD-10-CM | POA: Diagnosis not present

## 2024-08-08 ENCOUNTER — Other Ambulatory Visit: Payer: Self-pay

## 2024-08-08 ENCOUNTER — Other Ambulatory Visit (HOSPITAL_BASED_OUTPATIENT_CLINIC_OR_DEPARTMENT_OTHER): Payer: Self-pay

## 2024-08-08 MED ORDER — B-12 1000 MCG PO TABS
2000.0000 ug | ORAL_TABLET | Freq: Every day | ORAL | 0 refills | Status: AC
  Filled 2024-08-08: qty 200, 100d supply, fill #0

## 2024-08-23 ENCOUNTER — Other Ambulatory Visit (HOSPITAL_BASED_OUTPATIENT_CLINIC_OR_DEPARTMENT_OTHER): Payer: Self-pay

## 2024-08-23 MED ORDER — FLUZONE 0.5 ML IM SUSY
0.5000 mL | PREFILLED_SYRINGE | Freq: Once | INTRAMUSCULAR | 0 refills | Status: AC
  Filled 2024-08-23: qty 0.5, 1d supply, fill #0

## 2024-09-08 ENCOUNTER — Other Ambulatory Visit: Payer: Self-pay

## 2024-09-08 ENCOUNTER — Other Ambulatory Visit (HOSPITAL_BASED_OUTPATIENT_CLINIC_OR_DEPARTMENT_OTHER): Payer: Self-pay

## 2024-09-23 ENCOUNTER — Other Ambulatory Visit (HOSPITAL_BASED_OUTPATIENT_CLINIC_OR_DEPARTMENT_OTHER): Payer: Self-pay

## 2024-10-05 ENCOUNTER — Other Ambulatory Visit (HOSPITAL_BASED_OUTPATIENT_CLINIC_OR_DEPARTMENT_OTHER): Payer: Self-pay

## 2024-10-06 ENCOUNTER — Other Ambulatory Visit (HOSPITAL_BASED_OUTPATIENT_CLINIC_OR_DEPARTMENT_OTHER): Payer: Self-pay

## 2024-10-07 ENCOUNTER — Other Ambulatory Visit (HOSPITAL_BASED_OUTPATIENT_CLINIC_OR_DEPARTMENT_OTHER): Payer: Self-pay

## 2024-10-10 ENCOUNTER — Other Ambulatory Visit (HOSPITAL_BASED_OUTPATIENT_CLINIC_OR_DEPARTMENT_OTHER): Payer: Self-pay

## 2024-10-12 ENCOUNTER — Other Ambulatory Visit (HOSPITAL_BASED_OUTPATIENT_CLINIC_OR_DEPARTMENT_OTHER): Payer: Self-pay

## 2024-10-14 ENCOUNTER — Other Ambulatory Visit (HOSPITAL_BASED_OUTPATIENT_CLINIC_OR_DEPARTMENT_OTHER): Payer: Self-pay

## 2024-10-17 ENCOUNTER — Other Ambulatory Visit (HOSPITAL_BASED_OUTPATIENT_CLINIC_OR_DEPARTMENT_OTHER): Payer: Self-pay

## 2024-10-18 ENCOUNTER — Other Ambulatory Visit (HOSPITAL_BASED_OUTPATIENT_CLINIC_OR_DEPARTMENT_OTHER): Payer: Self-pay

## 2024-10-19 ENCOUNTER — Other Ambulatory Visit (HOSPITAL_BASED_OUTPATIENT_CLINIC_OR_DEPARTMENT_OTHER): Payer: Self-pay

## 2024-10-21 ENCOUNTER — Encounter (HOSPITAL_BASED_OUTPATIENT_CLINIC_OR_DEPARTMENT_OTHER): Payer: Self-pay

## 2024-10-21 ENCOUNTER — Other Ambulatory Visit (HOSPITAL_BASED_OUTPATIENT_CLINIC_OR_DEPARTMENT_OTHER): Payer: Self-pay

## 2024-10-21 MED ORDER — METHYLPHENIDATE HCL ER (OSM) 54 MG PO TBCR
54.0000 mg | EXTENDED_RELEASE_TABLET | Freq: Every day | ORAL | 0 refills | Status: DC
  Filled 2024-10-21: qty 30, 30d supply, fill #0

## 2024-10-25 ENCOUNTER — Other Ambulatory Visit (HOSPITAL_BASED_OUTPATIENT_CLINIC_OR_DEPARTMENT_OTHER): Payer: Self-pay

## 2024-11-04 ENCOUNTER — Other Ambulatory Visit (HOSPITAL_BASED_OUTPATIENT_CLINIC_OR_DEPARTMENT_OTHER): Payer: Self-pay

## 2024-11-17 ENCOUNTER — Other Ambulatory Visit (HOSPITAL_BASED_OUTPATIENT_CLINIC_OR_DEPARTMENT_OTHER): Payer: Self-pay

## 2024-11-17 MED ORDER — METHYLPHENIDATE HCL ER (OSM) 54 MG PO TBCR
54.0000 mg | EXTENDED_RELEASE_TABLET | Freq: Every day | ORAL | 0 refills | Status: AC
  Filled 2024-11-17 – 2024-11-23 (×2): qty 30, 30d supply, fill #0

## 2024-11-17 MED ORDER — METHYLPHENIDATE HCL ER (OSM) 54 MG PO TBCR
54.0000 mg | EXTENDED_RELEASE_TABLET | Freq: Every day | ORAL | 0 refills | Status: AC
  Filled 2024-11-17: qty 30, 30d supply, fill #0

## 2024-11-17 MED ORDER — QELBREE 200 MG PO CP24
200.0000 mg | ORAL_CAPSULE | Freq: Two times a day (BID) | ORAL | 2 refills | Status: AC
  Filled 2024-11-17 (×2): qty 180, 90d supply, fill #0

## 2024-11-17 MED ORDER — VILAZODONE HCL 40 MG PO TABS
40.0000 mg | ORAL_TABLET | Freq: Every day | ORAL | 2 refills | Status: AC
  Filled 2024-11-17: qty 90, 90d supply, fill #0

## 2024-11-21 ENCOUNTER — Other Ambulatory Visit (HOSPITAL_BASED_OUTPATIENT_CLINIC_OR_DEPARTMENT_OTHER): Payer: Self-pay

## 2024-11-23 ENCOUNTER — Other Ambulatory Visit (HOSPITAL_BASED_OUTPATIENT_CLINIC_OR_DEPARTMENT_OTHER): Payer: Self-pay

## 2024-11-23 MED ORDER — FINASTERIDE 1 MG PO TABS
1.0000 mg | ORAL_TABLET | Freq: Every day | ORAL | 3 refills | Status: AC
  Filled 2024-11-23: qty 90, 90d supply, fill #0

## 2024-11-23 MED ORDER — MINOXIDIL 2.5 MG PO TABS: 2.5000 mg | ORAL_TABLET | Freq: Every day | ORAL | 3 refills | Status: AC

## 2024-11-24 ENCOUNTER — Other Ambulatory Visit (HOSPITAL_BASED_OUTPATIENT_CLINIC_OR_DEPARTMENT_OTHER): Payer: Self-pay

## 2024-11-25 ENCOUNTER — Other Ambulatory Visit (HOSPITAL_BASED_OUTPATIENT_CLINIC_OR_DEPARTMENT_OTHER): Payer: Self-pay

## 2024-11-28 ENCOUNTER — Other Ambulatory Visit (HOSPITAL_BASED_OUTPATIENT_CLINIC_OR_DEPARTMENT_OTHER): Payer: Self-pay

## 2024-12-01 ENCOUNTER — Other Ambulatory Visit (HOSPITAL_BASED_OUTPATIENT_CLINIC_OR_DEPARTMENT_OTHER): Payer: Self-pay
